# Patient Record
Sex: Female | Born: 1975 | Hispanic: No | Marital: Single | Smoking: Current some day smoker
Health system: Southern US, Community
[De-identification: ages and names within clinical notes are randomized; demographics above are authoritative.]

## PROBLEM LIST (undated history)

## (undated) DIAGNOSIS — E119 Type 2 diabetes mellitus without complications: Secondary | ICD-10-CM

## (undated) DIAGNOSIS — D649 Anemia, unspecified: Secondary | ICD-10-CM

## (undated) HISTORY — PX: TUBAL LIGATION: SHX77

---

## 2018-01-11 ENCOUNTER — Emergency Department (HOSPITAL_COMMUNITY)
Admission: EM | Admit: 2018-01-11 | Discharge: 2018-01-11 | Disposition: A | Discharge status: Home / Self Care | Payer: Self-pay | Attending: Emergency Medicine | Admitting: Emergency Medicine

## 2018-01-11 ENCOUNTER — Encounter (HOSPITAL_COMMUNITY): Payer: Self-pay | Admitting: Emergency Medicine

## 2018-01-11 ENCOUNTER — Other Ambulatory Visit: Payer: Self-pay

## 2018-01-11 DIAGNOSIS — E119 Type 2 diabetes mellitus without complications: Secondary | ICD-10-CM | POA: Insufficient documentation

## 2018-01-11 DIAGNOSIS — N10 Acute tubulo-interstitial nephritis: Secondary | ICD-10-CM | POA: Insufficient documentation

## 2018-01-11 DIAGNOSIS — D649 Anemia, unspecified: Secondary | ICD-10-CM | POA: Insufficient documentation

## 2018-01-11 DIAGNOSIS — F17210 Nicotine dependence, cigarettes, uncomplicated: Secondary | ICD-10-CM | POA: Insufficient documentation

## 2018-01-11 DIAGNOSIS — N12 Tubulo-interstitial nephritis, not specified as acute or chronic: Secondary | ICD-10-CM

## 2018-01-11 HISTORY — DX: Anemia, unspecified: D64.9

## 2018-01-11 HISTORY — DX: Type 2 diabetes mellitus without complications: E11.9

## 2018-01-11 LAB — URINALYSIS, ROUTINE W REFLEX MICROSCOPIC
Bilirubin Urine: NEGATIVE
Glucose, UA: NEGATIVE mg/dL
Ketones, ur: 15 mg/dL — AB
Nitrite: POSITIVE — AB
Protein, ur: >300 mg/dL — AB
Specific Gravity, Urine: 1.025 (ref 1.005–1.030)
pH: 6.5 (ref 5.0–8.0)

## 2018-01-11 LAB — CBC
HCT: 35.8 % — ABNORMAL LOW (ref 36.0–46.0)
Hemoglobin: 11 g/dL — ABNORMAL LOW (ref 12.0–15.0)
MCH: 23.9 pg — ABNORMAL LOW (ref 26.0–34.0)
MCHC: 30.7 g/dL (ref 30.0–36.0)
MCV: 77.7 fL — ABNORMAL LOW (ref 78.0–100.0)
Platelets: 386 10*3/uL (ref 150–400)
RBC: 4.61 MIL/uL (ref 3.87–5.11)
RDW: 14.9 % (ref 11.5–15.5)
WBC: 8.4 10*3/uL (ref 4.0–10.5)

## 2018-01-11 LAB — URINALYSIS, MICROSCOPIC (REFLEX)
RBC / HPF: >50 RBC/hpf (ref 0–5)
WBC, UA: >50 WBC/hpf (ref 0–5)

## 2018-01-11 LAB — BASIC METABOLIC PANEL
Anion gap: 8 (ref 5–15)
BUN: 9 mg/dL (ref 6–20)
CO2: 21 mmol/L — ABNORMAL LOW (ref 22–32)
Calcium: 8.3 mg/dL — ABNORMAL LOW (ref 8.9–10.3)
Chloride: 109 mmol/L (ref 98–111)
Creatinine, Ser: 0.64 mg/dL (ref 0.44–1.00)
GFR calc Af Amer: >60 mL/min (ref >60)
GFR calc non Af Amer: >60 mL/min (ref >60)
Glucose, Bld: 109 mg/dL — ABNORMAL HIGH (ref 70–99)
Potassium: 3.7 mmol/L (ref 3.5–5.1)
Sodium: 138 mmol/L (ref 135–145)

## 2018-01-11 LAB — I-STAT BETA HCG BLOOD, ED (MC, WL, AP ONLY): I-stat hCG, quantitative: <5 m[IU]/mL (ref <5)

## 2018-01-11 MED ORDER — CEPHALEXIN 500 MG PO CAPS: 500.0000 mg | ORAL_CAPSULE | Freq: Two times a day (BID) | ORAL | 0 refills | Status: AC

## 2018-01-11 NOTE — ED Triage Notes (Signed)
Pt reports she began feeling like she had urinary urgency and pain with urination on Wednesday, states she is now having flank pain radiating to her lower abd as well.

## 2018-01-11 NOTE — ED Notes (Signed)
Patient verbalizes understanding of discharge instructions. Opportunity for questioning and answers were provided. Armband removed by staff, pt discharged from ED.  

## 2018-01-11 NOTE — Discharge Instructions (Addendum)
You were evaluated today for urinary frequency.  Your urine did show that you have a urinary tract infection.  Given your left sided flank pain it is possible you have an infection in your kidneys. I will prescribe you an antibiotic called Keflex. Please take as prescribed. Follow-up with your PCP if symptoms do not resolved.   Return to the ED with any new or worsening symptoms such as: Contact a health care provider if: Your symptoms do not get better after 2 days of treatment. Your symptoms get worse. You have a fever. Get help right away if: You are unable to take your antibiotics or fluids. You have shaking chills. You vomit. You have severe flank or back pain. You have extreme weakness or fainting.

## 2018-01-11 NOTE — ED Provider Notes (Signed)
[facility] EMERGENCY DEPARTMENT Provider Note   CSN: [id number] Arrival date & time: 01/11/18  1117   History   Chief Complaint Chief Complaint  Patient presents with  . Urinary Frequency    HPI Joy Cruz is a 42 y.o. female with chronic anemia who presents for evaluation of dysuria x2 days.  States she has a history of frequent UTIs.  States over the past 2 days she feels like she has to frequently urinate, as well as suprapubic pressure. Patient woke up this morning with left-sided flank pain.  Rates her pain a 5/10.  Pain does not radiate.  Denies aggravating or alleviating factors.  States she did start her menstrual cycle this morning.  Denies fever, chills, nausea, vomiting, chest pain, shortness of breath, diarrhea, constipation.  HPI  Past Medical History:  Diagnosis Date  . Anemia   . Diabetes mellitus without complication (HCC)     There are no active problems to display for this patient.   Past Surgical History:  Procedure Laterality Date  . CESAREAN SECTION       OB History   None      Home Medications    Prior to Admission medications   Medication Sig Start Date End Date Taking? Authorizing Provider  cephALEXin (KEFLEX) 500 MG capsule Take 1 capsule (500 mg total) by mouth 2 (two) times daily for 14 days. 01/11/18 01/25/18  Amariyon Maynes A, PA-C    Family History No family history on file.  Social History Social History   Tobacco Use  . Smoking status: Current Some Day Smoker  Substance Use Topics  . Alcohol use: Never    Frequency: Never  . Drug use: Never     Allergies   Patient has no known allergies.   Review of Systems Review of Systems  Constitutional: Negative for activity change, appetite change, chills, diaphoresis, fatigue, fever and unexpected weight change.  Respiratory: Negative for cough, chest tightness and shortness of breath.   Cardiovascular: Negative for chest pain and leg swelling.    Gastrointestinal: Negative for abdominal distention, blood in stool, constipation, diarrhea, nausea, rectal pain and vomiting.       Suprapubic pressure.  Genitourinary: Positive for dysuria, flank pain, frequency, urgency and vaginal bleeding. Negative for decreased urine volume, difficulty urinating, hematuria, menstrual problem, pelvic pain, vaginal discharge and vaginal pain.  Musculoskeletal: Negative for back pain.  Skin: Negative.   Neurological: Negative for dizziness, weakness, light-headedness, numbness and headaches.     Physical Exam Updated Vital Signs BP 121/77 (BP Location: Right Arm)   Pulse (!) 58   Temp 98.6 F (37 C) (Oral)   Resp 18   Ht 5\' 8"  (1.727 m)   Wt 132.9 kg   LMP 01/11/2018 (Exact Date)   SpO2 99%   BMI 44.55 kg/m   Physical Exam  Constitutional: She appears well-developed and well-nourished. No distress.  HENT:  Head: Atraumatic.  Mouth/Throat: Oropharynx is clear and moist.  Eyes: Pupils are equal, round, and reactive to light.  Neck: Normal range of motion.  Cardiovascular: Normal rate, regular rhythm, normal heart sounds and intact distal pulses. Exam reveals no gallop.  No murmur heard. Pulmonary/Chest: Effort normal and breath sounds normal. No stridor. No respiratory distress. She has no wheezes. She has no rales. She exhibits no tenderness.  Abdominal: Soft. Normal appearance and bowel sounds are normal. She exhibits no distension and no mass. There is no hepatosplenomegaly. There is no tenderness. There is no rigidity,  no rebound and no guarding. No hernia.  Mild left-sided CVA tenderness.  No suprapubic tenderness to palpation.   Musculoskeletal: Normal range of motion.       Lumbar back: Normal.  Neurological: She is alert.  Skin: Skin is warm and dry. She is not diaphoretic.  Psychiatric: She has a normal mood and affect.  Nursing note and vitals reviewed.    ED Treatments / Results  Labs (all labs ordered are listed, but only  abnormal results are displayed) Labs Reviewed  URINALYSIS, ROUTINE W REFLEX MICROSCOPIC - Abnormal; Notable for the following components:      Result Value   Color, Urine RED (*)    APPearance CLOUDY (*)    Hgb urine dipstick LARGE (*)    Ketones, ur 15 (*)    Protein, ur >300 (*)    Nitrite POSITIVE (*)    Leukocytes, UA MODERATE (*)    All other components within normal limits  CBC - Abnormal; Notable for the following components:   Hemoglobin 11.0 (*)    HCT 35.8 (*)    MCV 77.7 (*)    MCH 23.9 (*)    All other components within normal limits  BASIC METABOLIC PANEL - Abnormal; Notable for the following components:   CO2 21 (*)    Glucose, Bld 109 (*)    Calcium 8.3 (*)    All other components within normal limits  URINALYSIS, MICROSCOPIC (REFLEX) - Abnormal; Notable for the following components:   Bacteria, UA PRESENT (*)    All other components within normal limits  URINE CULTURE  I-STAT BETA HCG BLOOD, ED ([facility], [facility], [facility] ONLY)    EKG None  Radiology No results found.  Procedures Procedures (including critical care time)  Medications Ordered in ED Medications - No data to display   Initial Impression / Assessment and Plan / ED Course  I have reviewed the triage vital signs and the nursing notes as well as past medical history.  Pertinent labs & imaging results that were available during my care of the patient were reviewed by me and considered in my medical decision making (see chart for details).  42 year old female who appears otherwise well presents for evaluation of urinary frequency x2 days.  History of frequent UTIs.  Mild left CVA tenderness.  No suprapubic tenderness.  Urine with positive nitrite and moderate leukocytes.  Proteinuria with ketones.  Large hemoglobin and urine, however started menstrual cycle this morning. Labs without leukocytosis. Afebrile, nonseptic, non ill appearing.  HCG negative.  Given history and physical exam feel patient most likely  has uncomplicated pyelonephritis.  Will DC home with Keflex.  Low suspicion for phrenic abscess at this time. Patient does not meet the SIRS or Sepsis criteria.  On repeat exam patient does not have a surgical abdomin and there are no peritoneal signs.  No indication of appendicitis, bowel obstruction, bowel perforation, cholecystitis, diverticulitis, PID or ectopic pregnancy. Do not feel patient needs a CT scan as she has a benign abdominal exam with mild CVA tenderness.   Discussed with patient follow-up with her PCP for her proteinuria. Discussed with patient strict return precautions.  Patient stable for discharge home.  Patient voiced understanding of discharge instructions and is agreeable for follow-up.  Clinical Course as of Jan 12 1607  Sat Jan 11, 2018  1538 I-Stat beta hCG blood, ED [BH]  1550 Chronic anemia, on iron supplementation.  Hemoglobin(!): 11.0 [BH]    Clinical Course User Index [BH] Arnika Larzelere A,  PA-C    Final Clinical Impressions(s) / ED Diagnoses   Final diagnoses:  Pyelonephritis    ED Discharge Orders         Ordered    cephALEXin (KEFLEX) 500 MG capsule  2 times daily     01/11/18 1603           Shareese Macha A, PA-C 01/11/18 1612    Raeford Razor, MD 01/13/18 1359

## 2018-01-13 LAB — URINE CULTURE: Culture: >=100000 — AB

## 2018-01-14 ENCOUNTER — Telehealth: Payer: Self-pay | Admitting: Emergency Medicine

## 2018-01-14 NOTE — Telephone Encounter (Signed)
Post ED Visit - Positive Culture Follow-up  Culture report reviewed by antimicrobial stewardship pharmacist:  [x]  Enzo Bi, Pharm.D. []  Celedonio Miyamoto, Pharm.D., BCPS AQ-ID []  Garvin Fila, Pharm.D., BCPS []  Georgina Pillion, Pharm.D., BCPS []  [place], [address].D., BCPS, AAHIVP []  Estella Husk, Pharm.D., BCPS, AAHIVP []  Lysle Pearl, PharmD, BCPS []  Phillips Climes, PharmD, BCPS []  Agapito Games, PharmD, BCPS []  Verlan Friends, PharmD  Positive urine culture Treated with cephalexin, organism sensitive to the same and no further patient follow-up is required at this time.  Berle Mull 01/14/2018, 9:30 AM

## 2018-03-09 ENCOUNTER — Emergency Department (HOSPITAL_COMMUNITY)
Admission: EM | Admit: 2018-03-09 | Discharge: 2018-03-09 | Disposition: A | Discharge status: Home / Self Care | Payer: Self-pay | Attending: Emergency Medicine | Admitting: Emergency Medicine

## 2018-03-09 ENCOUNTER — Encounter (HOSPITAL_COMMUNITY): Payer: Self-pay

## 2018-03-09 ENCOUNTER — Other Ambulatory Visit: Payer: Self-pay

## 2018-03-09 DIAGNOSIS — N938 Other specified abnormal uterine and vaginal bleeding: Secondary | ICD-10-CM | POA: Insufficient documentation

## 2018-03-09 DIAGNOSIS — E119 Type 2 diabetes mellitus without complications: Secondary | ICD-10-CM | POA: Insufficient documentation

## 2018-03-09 DIAGNOSIS — Z79899 Other long term (current) drug therapy: Secondary | ICD-10-CM | POA: Insufficient documentation

## 2018-03-09 DIAGNOSIS — J45909 Unspecified asthma, uncomplicated: Secondary | ICD-10-CM | POA: Insufficient documentation

## 2018-03-09 DIAGNOSIS — Z7984 Long term (current) use of oral hypoglycemic drugs: Secondary | ICD-10-CM | POA: Insufficient documentation

## 2018-03-09 DIAGNOSIS — F17200 Nicotine dependence, unspecified, uncomplicated: Secondary | ICD-10-CM | POA: Insufficient documentation

## 2018-03-09 LAB — WET PREP, GENITAL
SPERM: NONE SEEN
TRICH WET PREP: NONE SEEN
Yeast Wet Prep HPF POC: NONE SEEN

## 2018-03-09 LAB — BASIC METABOLIC PANEL
Anion gap: 6 (ref 5–15)
BUN: 10 mg/dL (ref 6–20)
CALCIUM: 8.6 mg/dL — AB (ref 8.9–10.3)
CO2: 24 mmol/L (ref 22–32)
Chloride: 108 mmol/L (ref 98–111)
Creatinine, Ser: 0.56 mg/dL (ref 0.44–1.00)
GFR calc Af Amer: >60 mL/min (ref >60)
GLUCOSE: 132 mg/dL — AB (ref 70–99)
Potassium: 3.7 mmol/L (ref 3.5–5.1)
SODIUM: 138 mmol/L (ref 135–145)

## 2018-03-09 LAB — CBC
HCT: 35.3 % — ABNORMAL LOW (ref 36.0–46.0)
Hemoglobin: 10.6 g/dL — ABNORMAL LOW (ref 12.0–15.0)
MCH: 22.7 pg — AB (ref 26.0–34.0)
MCHC: 30 g/dL (ref 30.0–36.0)
MCV: 75.8 fL — AB (ref 80.0–100.0)
PLATELETS: 437 10*3/uL — AB (ref 150–400)
RBC: 4.66 MIL/uL (ref 3.87–5.11)
RDW: 16.1 % — AB (ref 11.5–15.5)
WBC: 7.1 10*3/uL (ref 4.0–10.5)
nRBC: 0 % (ref 0.0–0.2)

## 2018-03-09 LAB — I-STAT BETA HCG BLOOD, ED (MC, WL, AP ONLY): I-stat hCG, quantitative: <5 m[IU]/mL (ref <5)

## 2018-03-09 MED ORDER — MEDROXYPROGESTERONE ACETATE 5 MG PO TABS: 10.0000 mg | ORAL_TABLET | Freq: Every day | ORAL | 0 refills | Status: DC

## 2018-03-09 NOTE — ED Provider Notes (Signed)
[facility]-EMERGENCY DEPT Provider Note   CSN: [id number] Arrival date & time: 03/09/18  0945     History   Chief Complaint Chief Complaint  Patient presents with  . Vaginal Bleeding    HPI Joy Cruz is a 42 y.o. female.  HPI Patient is a 42 year old female presents the emergency department complaints of worsening vaginal bleeding since last night.  She reports this is the normal time for her menstrual cycle to start but reports her bleeding is much heavier.  She is gone through several pads to the night.  She is never had what sounds like dysfunctional uterine bleeding before.  No prior history of uterine fibroids.  No significant abdominal pain.  No lightheadedness or weakness.  She has had a history of anemia before including blood transfusion in January 2019 with iron transfusions when she was living in [place].  She recently relocated to [place] 2 months ago.  She does not have a primary care physician or gynecologist at this point.  Symptoms worsen since last night.   Past Medical History:  Diagnosis Date  . Anemia   . Diabetes mellitus without complication (HCC)     There are no active problems to display for this patient.   Past Surgical History:  Procedure Laterality Date  . CESAREAN SECTION       OB History   None      Home Medications    Prior to Admission medications   Medication Sig Start Date End Date Taking? Authorizing Provider  acetaminophen (TYLENOL) 500 MG tablet Take 1,000 mg by mouth every 6 (six) hours as needed for moderate pain or headache.   Yes [provider]  IRON PO Take 1 tablet by mouth 2 (two) times daily.   Yes [provider]  metFORMIN (GLUCOPHAGE) 500 MG tablet Take 500 mg by mouth 2 (two) times daily with a meal.   Yes [provider]  medroxyPROGESTERone (PROVERA) 5 MG tablet Take 2 tablets (10 mg total) by mouth daily for 10 days. 03/09/18 03/19/18  Azalia Bilis, MD      Family History No family history on file.  Social History Social History   Tobacco Use  . Smoking status: Current Some Day Smoker  . Smokeless tobacco: Never Used  . Tobacco comment: ceremonial reasons  Substance Use Topics  . Alcohol use: Never    Frequency: Never  . Drug use: Never     Allergies   Patient has no known allergies.   Review of Systems Review of Systems  All other systems reviewed and are negative.    Physical Exam Updated Vital Signs BP 130/87   Pulse (!) 56   Temp 98.2 F (36.8 C) (Oral)   Resp 16   Ht 5\' 8"  (1.727 m)   Wt 131.5 kg   LMP 03/08/2018   SpO2 100%   BMI 44.09 kg/m   Physical Exam  Constitutional: She is oriented to person, place, and time. She appears well-developed and well-nourished. No distress.  HENT:  Head: Normocephalic and atraumatic.  Eyes: EOM are normal.  Neck: Normal range of motion.  Cardiovascular: Normal rate and regular rhythm.  Pulmonary/Chest: Effort normal and breath sounds normal.  Abdominal: Soft. She exhibits no distension. There is no tenderness.  Genitourinary:  Genitourinary Comments: Chaperone present.  Small amount of blood coming from the cervical loss.  No vaginal lesions noted  Musculoskeletal: Normal range of motion.  Neurological: She is alert and oriented to person, place,  and time.  Skin: Skin is warm and dry.  Psychiatric: She has a normal mood and affect. Judgment normal.  Nursing note and vitals reviewed.    ED Treatments / Results  Labs (all labs ordered are listed, but only abnormal results are displayed) Labs Reviewed  CBC - Abnormal; Notable for the following components:      Result Value   Hemoglobin 10.6 (*)    HCT 35.3 (*)    MCV 75.8 (*)    MCH 22.7 (*)    RDW 16.1 (*)    Platelets 437 (*)    All other components within normal limits  BASIC METABOLIC PANEL - Abnormal; Notable for the following components:   Glucose, Bld 132 (*)    Calcium 8.6 (*)    All other  components within normal limits  I-STAT BETA HCG BLOOD, ED ([facility], [facility], [facility] ONLY)  WET PREP  (BD AFFIRM) (Benjamin Perez)  GC/CHLAMYDIA PROBE AMP ([place]) NOT AT [facility]    EKG None  Radiology No results found.  Procedures Procedures (including critical care time)  Medications Ordered in ED Medications - No data to display   Initial Impression / Assessment and Plan / ED Course  I have reviewed the triage vital signs and the nursing notes.  Pertinent labs & imaging results that were available during my care of the patient were reviewed by me and considered in my medical decision making (see chart for details).     She will be placed on a case of Provera.  GYN follow-up.  Patient understands [facility] for any new or worsening symptoms.  Hemoglobin 10.6.  vital signs are without significant abnormality.  Final Clinical Impressions(s) / ED Diagnoses   Final diagnoses:  Dysfunctional uterine bleeding    ED Discharge Orders         Ordered    medroxyPROGESTERone (PROVERA) 5 MG tablet  Daily     03/09/18 1218           Azalia Bilisampos, Aslin Farinas, MD 03/10/18 1720

## 2018-03-09 NOTE — ED Triage Notes (Signed)
Pt states that her period started yesterday, and that she has had a significant amount of bleeding. Pt states that she has been going through 4 overnight pads/hour.

## 2018-03-10 LAB — GC/CHLAMYDIA PROBE AMP (~~LOC~~) NOT AT ARMC
Chlamydia: NEGATIVE
NEISSERIA GONORRHEA: NEGATIVE

## 2018-03-31 ENCOUNTER — Encounter: Payer: Self-pay | Admitting: Obstetrics and Gynecology

## 2018-03-31 ENCOUNTER — Telehealth: Payer: Self-pay | Admitting: General Practice

## 2018-03-31 ENCOUNTER — Ambulatory Visit (INDEPENDENT_AMBULATORY_CARE_PROVIDER_SITE_OTHER): Payer: Self-pay | Admitting: Obstetrics and Gynecology

## 2018-03-31 VITALS — BP 126/77 | HR 62 | Wt 286.3 lb

## 2018-03-31 DIAGNOSIS — N939 Abnormal uterine and vaginal bleeding, unspecified: Secondary | ICD-10-CM

## 2018-03-31 DIAGNOSIS — R102 Pelvic and perineal pain: Secondary | ICD-10-CM

## 2018-03-31 MED ORDER — MEDROXYPROGESTERONE ACETATE 10 MG PO TABS: 20.0000 mg | ORAL_TABLET | Freq: Every day | ORAL | 2 refills | Status: DC

## 2018-03-31 NOTE — Addendum Note (Signed)
Addended by: Leroy LibmanAVIS, Bernard Slayden on: 03/31/2018 04:27 PM   Modules accepted: Orders

## 2018-03-31 NOTE — Telephone Encounter (Signed)
Patient called and left message on nurse voicemail line [facility] there[facility]   Dr Earlene Plateravis ordered Rx for patient. Called patient, no answer- message states call cannot be completed at this time.

## 2018-03-31 NOTE — Progress Notes (Signed)
GYNECOLOGY OFFICE FOLLOW UP NOTE  History:  42 y.o. [id number] here today for follow up for ED visit for heavy bleeding. She was seen because she had very heavy bleeding when she started her period this most recent time. She was scared when she had "dripping" of her period, was much heavier than it had ever been before. Went to ED and was prescribed provera, which slowed her bleeding down but it has not stopped yet.  Has never had heavy bleeding like this. Reports yesterday and today she is having bleeding like a period. Having "mild to moderate" pain like menstrual cramps around her pelvis and her lower back.   Normally has regular, monthly periods. Doesn't miss periods and they are not heavy like this. H/o IV iron transfusion in 03/2017 for severe anemia.  Past Medical History:  Diagnosis Date  . Anemia   . Diabetes mellitus without complication [facility](HCC)     Past Surgical History:  Procedure Laterality Date  . CESAREAN SECTION    . TUBAL LIGATION    3 x SVD, 1 x CS   Current Outpatient Medications:  .  acetaminophen (TYLENOL) 500 MG tablet, Take 1,000 mg by mouth every 6 (six) hours as needed for moderate pain or headache., Disp: , Rfl:  .  IRON PO, Take 1 tablet by mouth 2 (two) times daily., Disp: , Rfl:  .  metFORMIN (GLUCOPHAGE) 500 MG tablet, Take 500 mg by mouth 2 (two) times daily with a meal., Disp: , Rfl:   The following portions of the patient's history were reviewed and updated as appropriate: allergies, current medications, past family history, past medical history, past social history, past surgical history and problem list.   Review of Systems:  Pertinent items noted in HPI and remainder of comprehensive ROS otherwise negative.   Objective:  Physical Exam BP 126/77   Pulse 62   Wt 286 lb 4.8 oz (129.9 kg)   LMP 03/08/2018   BMI 43.53 kg/m  CONSTITUTIONAL: Well-developed, well-nourished female in no acute distress.  HENT:  Normocephalic, atraumatic. External  right and left ear normal. Oropharynx is clear and moist EYES: Conjunctivae and EOM are normal. Pupils are equal, round, and reactive to light. No scleral icterus.  NECK: Normal range of motion, supple, no masses SKIN: Skin is warm and dry. No rash noted. Not diaphoretic. No erythema. No pallor. NEUROLOGIC: Alert and oriented to person, place, and time. Normal reflexes, muscle tone coordination. No cranial nerve deficit noted. PSYCHIATRIC: Normal mood and affect. Normal behavior. Normal judgment and thought content. CARDIOVASCULAR: Normal heart rate noted RESPIRATORY: Effort normal, no problems with respiration noted ABDOMEN: Soft, no distention noted.   PELVIC: Normal appearing external genitalia; normal appearing vaginal mucosa and cervix.  No abnormal discharge noted.  Normal uterine size, no other palpable masses, no uterine tenderness. Mild right adnexal tenderness MUSCULOSKELETAL: Normal range of motion. No edema noted.  Labs and Imaging No results found.  Assessment & Plan:  1. Abnormal uterine bleeding (AUB) - will obtain US and have patient return for EMB - to continue provera for now - briefly reviewed possible causes for AUB and need for further workup, she is agreeable - TSH - US PELVIC COMPLETE WITH TRANSVAGINAL; Future - CBC  2. Pelvic pain See above  Will obtain records from GYN in [place], states had pap 1 year ago there  Routine preventative health maintenance measures emphasized. Please refer to After Visit Summary for other counseling recommendations.   Return in about 3  weeks (around 04/21/2018) for Followup.  Total face-to-face time with patient: 30 minutes. Over 50% of encounter was spent on counseling and coordination of care.  Baldemar Lenis, M.D. Attending Center for [employer] [facility])

## 2018-03-31 NOTE — Progress Notes (Signed)
Ultrasound scheduled for 04/03/18 @ 11am. Pt made aware.

## 2018-04-01 LAB — CBC
HEMATOCRIT: 30.4 % — AB (ref 34.0–46.6)
HEMOGLOBIN: 10 g/dL — AB (ref 11.1–15.9)
MCH: 22.7 pg — AB (ref 26.6–33.0)
MCHC: 32.9 g/dL (ref 31.5–35.7)
MCV: 69 fL — AB (ref 79–97)
Platelets: 465 10*3/uL — ABNORMAL HIGH (ref 150–450)
RBC: 4.4 x10E6/uL (ref 3.77–5.28)
RDW: 14.4 % (ref 12.3–15.4)
WBC: 8.2 10*3/uL (ref 3.4–10.8)

## 2018-04-01 LAB — TSH: TSH: 2.67 u[IU]/mL (ref 0.450–4.500)

## 2018-04-03 ENCOUNTER — Ambulatory Visit (HOSPITAL_COMMUNITY)
Admission: RE | Admit: 2018-04-03 | Discharge: 2018-04-03 | Disposition: A | Discharge status: Home / Self Care | Payer: Self-pay | Source: Ambulatory Visit | Attending: Obstetrics and Gynecology | Admitting: Obstetrics and Gynecology

## 2018-04-03 DIAGNOSIS — N854 Malposition of uterus: Secondary | ICD-10-CM | POA: Insufficient documentation

## 2018-04-03 DIAGNOSIS — Q505 Embryonic cyst of broad ligament: Secondary | ICD-10-CM | POA: Insufficient documentation

## 2018-04-03 DIAGNOSIS — N939 Abnormal uterine and vaginal bleeding, unspecified: Secondary | ICD-10-CM | POA: Insufficient documentation

## 2018-04-29 ENCOUNTER — Emergency Department (HOSPITAL_COMMUNITY)
Admission: EM | Admit: 2018-04-29 | Discharge: 2018-04-29 | Disposition: A | Discharge status: Home / Self Care | Payer: Self-pay | Attending: Emergency Medicine | Admitting: Emergency Medicine

## 2018-04-29 ENCOUNTER — Other Ambulatory Visit: Payer: Self-pay

## 2018-04-29 DIAGNOSIS — E119 Type 2 diabetes mellitus without complications: Secondary | ICD-10-CM | POA: Insufficient documentation

## 2018-04-29 DIAGNOSIS — Z7984 Long term (current) use of oral hypoglycemic drugs: Secondary | ICD-10-CM | POA: Insufficient documentation

## 2018-04-29 DIAGNOSIS — F17210 Nicotine dependence, cigarettes, uncomplicated: Secondary | ICD-10-CM | POA: Insufficient documentation

## 2018-04-29 DIAGNOSIS — L600 Ingrowing nail: Secondary | ICD-10-CM | POA: Insufficient documentation

## 2018-04-29 DIAGNOSIS — Z79899 Other long term (current) drug therapy: Secondary | ICD-10-CM | POA: Insufficient documentation

## 2018-04-29 LAB — CBG MONITORING, ED: Glucose-Capillary: 139 mg/dL — ABNORMAL HIGH (ref 70–99)

## 2018-04-29 MED ORDER — TRAMADOL HCL 50 MG PO TABS: 50.0000 mg | ORAL_TABLET | Freq: Four times a day (QID) | ORAL | 0 refills | Status: DC | PRN

## 2018-04-29 MED ORDER — SULFAMETHOXAZOLE-TRIMETHOPRIM 800-160 MG PO TABS: 1.0000 | ORAL_TABLET | Freq: Two times a day (BID) | ORAL | 0 refills | Status: AC

## 2018-04-29 MED ORDER — LIDOCAINE HCL (PF) 1 % IJ SOLN
30.0000 mL | Freq: Once | INTRAMUSCULAR | Status: AC
  Administered 2018-04-29: 30 mL
  Filled 2018-04-29: qty 30

## 2018-04-29 NOTE — ED Notes (Signed)
Patient verbalized understanding of discharge instructions and denies any further needs or questions at this time. VS stable. Patient ambulatory with steady gait.  

## 2018-04-29 NOTE — ED Provider Notes (Addendum)
[facility] EMERGENCY DEPARTMENT Provider Note   CSN: [id number] Arrival date & time: 04/29/18  1110     History   Chief Complaint No chief complaint on file.   HPI Joy Cruz is a 43 y.o. female.  43 year old female with prior medical history as detailed below presents for evaluation of ingrown nail on the left great toe.  Patient reports prior history of ingrown nails.  Patient reports that her left great toenail has become tender and inflamed.  She reports drainage from the lateral aspect of the left great toenail.  This drainage is been over the last 3 to 4 days.  She denies fever.  She denies other complaint.  She requests removal of the ingrown nail.  She reports a prior history of diabetes which is well controlled.  She denies prior significant history of foot infection.  The history is provided by the patient and medical records.  Illness  Location:  Left great toenail infection Severity:  Mild Duration:  6 days Timing:  Constant Progression:  Worsening Chronicity:  New Associated symptoms: no fever     Past Medical History:  Diagnosis Date  . Anemia   . Diabetes mellitus without complication (HCC)     There are no active problems to display for this patient.   Past Surgical History:  Procedure Laterality Date  . CESAREAN SECTION    . TUBAL LIGATION       OB History   No obstetric history on file.      Home Medications    Prior to Admission medications   Medication Sig Start Date End Date Taking? Authorizing Provider  acetaminophen (TYLENOL) 500 MG tablet Take 1,000 mg by mouth every 6 (six) hours as needed for moderate pain or headache.    [provider]  IRON PO Take 1 tablet by mouth 2 (two) times daily.    [provider]  medroxyPROGESTERone (PROVERA) 10 MG tablet Take 2 tablets (20 mg total) by mouth daily. 03/31/18   Conan Bowens, MD  metFORMIN (GLUCOPHAGE) 500 MG tablet Take 500 mg by mouth 2  (two) times daily with a meal.    [provider]    Family History No family history on file.  Social History Social History   Tobacco Use  . Smoking status: Current Some Day Smoker  . Smokeless tobacco: Never Used  . Tobacco comment: ceremonial reasons  Substance Use Topics  . Alcohol use: Never    Frequency: Never  . Drug use: Never     Allergies   Patient has no known allergies.   Review of Systems Review of Systems  Constitutional: Negative for fever.  All other systems reviewed and are negative.    Physical Exam Updated Vital Signs BP 123/83 (BP Location: Right Arm)   Pulse 72   Temp 98.2 F (36.8 C) (Oral)   Resp 16   Ht 5\' 8"  (1.727 m)   Wt 130.6 kg   LMP 03/04/2018 (Approximate)   SpO2 98%   BMI 43.79 kg/m   Physical Exam Vitals signs and nursing note reviewed.  Constitutional:      Appearance: Normal appearance.  HENT:     Head: Normocephalic and atraumatic.     Mouth/Throat:     Mouth: Mucous membranes are moist.  Eyes:     Pupils: Pupils are equal, round, and reactive to light.  Neck:     Musculoskeletal: Normal range of motion.  Cardiovascular:     Rate and  Rhythm: Normal rate and regular rhythm.  Pulmonary:     Effort: Pulmonary effort is normal.     Breath sounds: Normal breath sounds.  Musculoskeletal: Normal range of motion.  Skin:    General: Skin is warm.     Comments: Mild irritation of the left lateral aspect of the right great toenail.  Nail is ingrown.  There is no significant cellulitis or abscess formation.  Neurological:     General: No focal deficit present.     Mental Status: She is alert and oriented to person, place, and time. Mental status is at baseline.      ED Treatments / Results  Labs (all labs ordered are listed, but only abnormal results are displayed) Labs Reviewed  CBG MONITORING, ED - Abnormal; Notable for the following components:      Result Value   Glucose-Capillary 139 (*)    All  other components within normal limits    EKG None  Radiology No results found.  Procedures Excise ingrown toenail Date/Time: 04/29/2018 12:47 PM Performed by: Wynetta FinesMessick, Caelan Branden C, MD Authorized by: Wynetta FinesMessick, Aerielle Stoklosa C, MD  Consent: Verbal consent obtained. Consent given by: patient Patient understanding: patient states understanding of the procedure being performed Patient consent: the patient's understanding of the procedure matches consent given Procedure consent: procedure consent matches procedure scheduled Relevant documents: relevant documents present and verified Test results: test results available and properly labeled Site marked: the operative site was marked Imaging studies: imaging studies available Patient identity confirmed: verbally with patient Time out: Immediately prior to procedure a "time out" was called to verify the correct patient, procedure, equipment, support staff and site/side marked as required. Preparation: Patient was prepped and draped in the usual sterile fashion. Local anesthesia used: yes Anesthesia: local infiltration  Anesthesia: Local anesthesia used: yes Local Anesthetic: lidocaine 1% without epinephrine Anesthetic total: 1 mL  Sedation: Patient sedated: no  Patient tolerance: Patient tolerated the procedure well with no immediate complications    (including critical care time)  Medications Ordered in ED Medications  lidocaine (PF) (XYLOCAINE) 1 % injection 30 mL (has no administration in time range)     Initial Impression / Assessment and Plan / ED Course  I have reviewed the triage vital signs and the nursing notes.  Pertinent labs & imaging results that were available during my care of the patient were reviewed by me and considered in my medical decision making (see chart for details).     MDM  Screen complete  Patient is presenting for evaluation of ingrown left great toenail.  Toenail does appear to be minimally  infected.  Patient consented to removal of the affected portion of the toenail.  This was done without significant complication.  Patient understands need for close follow-up.  She will be prescribed antibiotics and pain medication.  She does understand the need for close follow-up with both her regular doctor and podiatry.  Strict return precautions given and understood.  Importance of close follow-up is stressed.  Final Clinical Impressions(s) / ED Diagnoses   Final diagnoses:  Ingrown nail    ED Discharge Orders         Ordered    sulfamethoxazole-trimethoprim (BACTRIM DS,SEPTRA DS) 800-160 MG tablet  2 times daily     04/29/18 1245    traMADol (ULTRAM) 50 MG tablet  Every 6 hours PRN     04/29/18 1245           Wynetta FinesMessick, Kirk Sampley C, MD 04/29/18 1247  Wynetta FinesMessick, Monnie Gudgel C, MD 04/29/18 1248

## 2018-04-29 NOTE — ED Triage Notes (Signed)
Pt presents to ED for ingrown toenail on her L great toe x 1 week. Endorses puss draining out of it and pain that shoots to her head when she wears shoes. Has type 2 DM. Has been taking extra strength tylenol with intermittent relief.

## 2018-04-29 NOTE — Discharge Instructions (Signed)
Please return for any problem.  Follow-up with your regular care provider as instructed.  Follow-up with podiatry as instructed.

## 2018-08-30 ENCOUNTER — Encounter (HOSPITAL_COMMUNITY): Payer: Self-pay | Admitting: Emergency Medicine

## 2018-08-30 ENCOUNTER — Emergency Department (HOSPITAL_COMMUNITY)
Admission: EM | Admit: 2018-08-30 | Discharge: 2018-08-30 | Disposition: A | Discharge status: Home / Self Care | Payer: PRIVATE HEALTH INSURANCE | Attending: Emergency Medicine | Admitting: Emergency Medicine

## 2018-08-30 ENCOUNTER — Other Ambulatory Visit: Payer: Self-pay

## 2018-08-30 DIAGNOSIS — E119 Type 2 diabetes mellitus without complications: Secondary | ICD-10-CM | POA: Insufficient documentation

## 2018-08-30 DIAGNOSIS — Z7984 Long term (current) use of oral hypoglycemic drugs: Secondary | ICD-10-CM | POA: Insufficient documentation

## 2018-08-30 DIAGNOSIS — L03032 Cellulitis of left toe: Secondary | ICD-10-CM | POA: Insufficient documentation

## 2018-08-30 DIAGNOSIS — F17200 Nicotine dependence, unspecified, uncomplicated: Secondary | ICD-10-CM | POA: Insufficient documentation

## 2018-08-30 MED ORDER — SULFAMETHOXAZOLE-TRIMETHOPRIM 800-160 MG PO TABS: 1.0000 | ORAL_TABLET | Freq: Two times a day (BID) | ORAL | 0 refills | Status: AC

## 2018-08-30 MED ORDER — BUPIVACAINE HCL (PF) 0.5 % IJ SOLN
20.0000 mL | Freq: Once | INTRAMUSCULAR | Status: AC
  Administered 2018-08-30: 12:00:00 20 mL
  Filled 2018-08-30: qty 30

## 2018-08-30 MED ORDER — BACITRACIN ZINC 500 UNIT/GM EX OINT
TOPICAL_OINTMENT | CUTANEOUS | Status: AC
  Filled 2018-08-30: qty 0.9

## 2018-08-30 NOTE — ED Triage Notes (Signed)
Pt reports is type 2 diabetic and having swelling on left big toe for couple weeks after having it cut when got a pedicure back in March. For past week when press on it, pus will come out.

## 2018-08-30 NOTE — ED Provider Notes (Signed)
[facility]-EMERGENCY DEPT Provider Note   CSN: [id number] Arrival date & time: 08/30/18  1119    History   Chief Complaint Chief Complaint  Patient presents with  . Toe Pain    HPI Joy Cruz is a 43 y.o. female.     Pt presents to the ED today with left great toe pain.  The pt said she had a pedicure in March and the pedicurist cut her skin.  She has noticed some swelling at the tip of her toe.  She denies any f/c.  She is diabetic, and her sugars have been doing well.     Past Medical History:  Diagnosis Date  . Anemia   . Diabetes mellitus without complication (HCC)     There are no active problems to display for this patient.   Past Surgical History:  Procedure Laterality Date  . CESAREAN SECTION    . TUBAL LIGATION       OB History   No obstetric history on file.      Home Medications    Prior to Admission medications   Medication Sig Start Date End Date Taking? Authorizing Provider  acetaminophen (TYLENOL) 500 MG tablet Take 1,000 mg by mouth every 6 (six) hours as needed for moderate pain or headache.    [provider]  IRON PO Take 1 tablet by mouth 2 (two) times daily.    [provider]  medroxyPROGESTERone (PROVERA) 10 MG tablet Take 2 tablets (20 mg total) by mouth daily. 03/31/18   Conan Bowens, MD  metFORMIN (GLUCOPHAGE) 500 MG tablet Take 500 mg by mouth 2 (two) times daily with a meal.    [provider]  sulfamethoxazole-trimethoprim (BACTRIM DS) 800-160 MG tablet Take 1 tablet by mouth 2 (two) times daily for 7 days. 08/30/18 09/06/18  Jacalyn Lefevre, MD  traMADol (ULTRAM) 50 MG tablet Take 1 tablet (50 mg total) by mouth every 6 (six) hours as needed. 04/29/18   Wynetta Fines, MD    Family History No family history on file.  Social History Social History   Tobacco Use  . Smoking status: Current Some Day Smoker  . Smokeless tobacco: Never Used  . Tobacco comment: ceremonial  reasons  Substance Use Topics  . Alcohol use: Never    Frequency: Never  . Drug use: Never     Allergies   Patient has no known allergies.   Review of Systems Review of Systems  Musculoskeletal:       Left great toe pain  All other systems reviewed and are negative.    Physical Exam Updated Vital Signs BP (!) 135/91 (BP Location: Left Arm)   Pulse 64   Temp 98.1 F (36.7 C) (Oral)   Resp 18   LMP 08/09/2018   SpO2 99%   Physical Exam Vitals signs and nursing note reviewed.  Constitutional:      Appearance: Normal appearance.  HENT:     Head: Normocephalic and atraumatic.     Right Ear: External ear normal.     Left Ear: External ear normal.     Nose: Nose normal.     Mouth/Throat:     Mouth: Mucous membranes are moist.     Pharynx: Oropharynx is clear.  Eyes:     Extraocular Movements: Extraocular movements intact.     Conjunctiva/sclera: Conjunctivae normal.     Pupils: Pupils are equal, round, and reactive to light.  Neck:     Musculoskeletal: Normal range of  motion and neck supple.  Cardiovascular:     Rate and Rhythm: Normal rate and regular rhythm.     Pulses: Normal pulses.     Heart sounds: Normal heart sounds.  Pulmonary:     Effort: Pulmonary effort is normal.     Breath sounds: Normal breath sounds.  Abdominal:     General: Abdomen is flat. Bowel sounds are normal.     Palpations: Abdomen is soft.  Musculoskeletal:     Comments: Left great toe with paronychia  Skin:    General: Skin is warm.     Capillary Refill: Capillary refill takes less than 2 seconds.  Neurological:     General: No focal deficit present.     Mental Status: She is alert and oriented to person, place, and time.  Psychiatric:        Mood and Affect: Mood normal.        Behavior: Behavior normal.        Thought Content: Thought content normal.        Judgment: Judgment normal.      ED Treatments / Results  Labs (all labs ordered are listed, but only abnormal  results are displayed) Labs Reviewed - No data to display  EKG None  Radiology No results found.  Procedures .Marland Kitchen.Incision and Drainage Date/Time: 08/30/2018 12:32 PM Performed by: Jacalyn LefevreHaviland, Nabila Albarracin, MD Authorized by: Jacalyn LefevreHaviland, Rhyleigh Grassel, MD   Consent:    Consent obtained:  Verbal   Consent given by:  Patient   Risks discussed:  Bleeding and incomplete drainage   Alternatives discussed:  No treatment Location:    Type:  Abscess   Location:  Lower extremity   Lower extremity location:  Toe   Toe location:  L big toe Pre-procedure details:    Skin preparation:  Betadine Anesthesia (see MAR for exact dosages):    Anesthesia method:  Nerve block   Block needle gauge:  25 G   Block anesthetic:  Bupivacaine 0.5% w/o epi   Block technique:  Digital   Block injection procedure:  Anatomic landmarks identified, introduced needle, incremental injection, negative aspiration for blood and anatomic landmarks palpated   Block outcome:  Anesthesia achieved Procedure type:    Complexity:  Simple Procedure details:    Incision types:  Single straight   Scalpel blade:  11   Wound management:  Probed and deloculated   Drainage:  Bloody   Drainage amount:  Scant   Wound treatment:  Wound left open   Packing materials:  None Post-procedure details:    Patient tolerance of procedure:  Tolerated well, no immediate complications   (including critical care time)  Medications Ordered in ED Medications  bupivacaine (MARCAINE) 0.5 % injection 20 mL (20 mLs Infiltration Given by Other 08/30/18 1151)     Initial Impression / Assessment and Plan / ED Course  I have reviewed the triage vital signs and the nursing notes.  Pertinent labs & imaging results that were available during my care of the patient were reviewed by me and considered in my medical decision making (see chart for details).       Pt is instructed to f/u with Dr. Logan BoresEvans from podiatry.  Return if worse.  Final Clinical  Impressions(s) / ED Diagnoses   Final diagnoses:  Paronychia of great toe, left    ED Discharge Orders         Ordered    sulfamethoxazole-trimethoprim (BACTRIM DS) 800-160 MG tablet  2 times daily  08/30/18 1211           Jacalyn Lefevre, MD 08/30/18 1234

## 2019-03-05 ENCOUNTER — Emergency Department (HOSPITAL_COMMUNITY): Payer: PRIVATE HEALTH INSURANCE

## 2019-03-05 ENCOUNTER — Other Ambulatory Visit: Payer: Self-pay

## 2019-03-05 ENCOUNTER — Emergency Department (HOSPITAL_COMMUNITY)
Admission: EM | Admit: 2019-03-05 | Discharge: 2019-03-05 | Disposition: A | Discharge status: Home / Self Care | Payer: PRIVATE HEALTH INSURANCE | Attending: Emergency Medicine | Admitting: Emergency Medicine

## 2019-03-05 DIAGNOSIS — U071 COVID-19: Secondary | ICD-10-CM | POA: Diagnosis not present

## 2019-03-05 DIAGNOSIS — Z79899 Other long term (current) drug therapy: Secondary | ICD-10-CM | POA: Diagnosis not present

## 2019-03-05 DIAGNOSIS — E119 Type 2 diabetes mellitus without complications: Secondary | ICD-10-CM | POA: Insufficient documentation

## 2019-03-05 DIAGNOSIS — R0602 Shortness of breath: Secondary | ICD-10-CM | POA: Diagnosis not present

## 2019-03-05 DIAGNOSIS — B349 Viral infection, unspecified: Secondary | ICD-10-CM | POA: Insufficient documentation

## 2019-03-05 DIAGNOSIS — Z7984 Long term (current) use of oral hypoglycemic drugs: Secondary | ICD-10-CM | POA: Insufficient documentation

## 2019-03-05 DIAGNOSIS — F17210 Nicotine dependence, cigarettes, uncomplicated: Secondary | ICD-10-CM | POA: Diagnosis not present

## 2019-03-05 DIAGNOSIS — Z20822 Contact with and (suspected) exposure to covid-19: Secondary | ICD-10-CM

## 2019-03-05 DIAGNOSIS — R05 Cough: Secondary | ICD-10-CM | POA: Diagnosis present

## 2019-03-05 MED ORDER — DEXTROMETHORPHAN HBR 15 MG/5ML PO SYRP: 10.0000 mL | ORAL_SOLUTION | Freq: Four times a day (QID) | ORAL | 0 refills | Status: DC | PRN

## 2019-03-05 MED ORDER — ACETAMINOPHEN 500 MG PO TABS
1000.0000 mg | ORAL_TABLET | Freq: Once | ORAL | Status: AC
  Administered 2019-03-05: 1000 mg via ORAL
  Filled 2019-03-05: qty 2

## 2019-03-05 MED ORDER — BENZONATATE 100 MG PO CAPS: 100.0000 mg | ORAL_CAPSULE | Freq: Three times a day (TID) | ORAL | 0 refills | Status: DC

## 2019-03-05 NOTE — Discharge Instructions (Addendum)
Use tylenol for pain. Your COVID test is pending; you should expect results in 2-3 days. You can access your results on your MyChart--if you test positive you should receive a phone call.  In the meantime follow CDC guidelines and quarantine, wear a mask, wash hands often.  Please take over the counter vitamin D 2000-4000 units per day. I have also recommend  zinc 50 mg per day for the next two weeks.   Please return to ED if you feel have difficulty breathing or have emergent, new or concerning symptoms.  Patients who have symptoms consistent with COVID-19 should self isolated for: At least 3 days (72 hours) have passed since recovery, defined as resolution of fever without the use of fever reducing medications and improvement in respiratory symptoms (e.g., cough, shortness of breath), and At least 7 days have passed since symptoms first appeared.       Person Under Monitoring Name: Joy Cruz  Location: [address] [place] [address]   Infection Prevention Recommendations for Individuals Confirmed to have, or Being Evaluated for, 2019 Novel Coronavirus (COVID-19) Infection Who Receive Care at Home  Individuals who are confirmed to have, or are being evaluated for, COVID-19 should follow the prevention steps below until a healthcare provider or local or state health department says they can return to normal activities.  Stay home except to get medical care You should restrict activities outside your home, except for getting medical care. Do not go to work, school, or public areas, and do not use public transportation or taxis.  Call ahead before visiting your doctor Before your medical appointment, call the healthcare provider and tell them that you have, or are being evaluated for, COVID-19 infection. This will help the healthcare providers office take steps to keep other people from getting infected. Ask your healthcare provider to call the local or state  health department.  Monitor your symptoms Seek prompt medical attention if your illness is worsening (e.g., difficulty breathing). Before going to your medical appointment, call the healthcare provider and tell them that you have, or are being evaluated for, COVID-19 infection. Ask your healthcare provider to call the local or state health department.  Wear a facemask You should wear a facemask that covers your nose and mouth when you are in the same room with other people and when you visit a healthcare provider. People who live with or visit you should also wear a facemask while they are in the same room with you.  Separate yourself from other people in your home As much as possible, you should stay in a different room from other people in your home. Also, you should use a separate bathroom, if available.  Avoid sharing household items You should not share dishes, drinking glasses, cups, eating utensils, towels, bedding, or other items with other people in your home. After using these items, you should wash them thoroughly with soap and water.  Cover your coughs and sneezes Cover your mouth and nose with a tissue when you cough or sneeze, or you can cough or sneeze into your sleeve. Throw used tissues in a lined trash can, and immediately wash your hands with soap and water for at least 20 seconds or use an alcohol-based hand rub.  Wash your [facility] your hands often and thoroughly with soap and water for at least 20 seconds. You can use an alcohol-based hand sanitizer if soap and water are not available and if your hands are not visibly  dirty. Avoid touching your eyes, nose, and mouth with unwashed hands.   Prevention Steps for Caregivers and Household Members of Individuals Confirmed to have, or Being Evaluated for, COVID-19 Infection Being Cared for in the Home  If you live with, or provide care at home for, a person confirmed to have, or being evaluated for, COVID-19  infection please follow these guidelines to prevent infection:  Follow healthcare providers instructions Make sure that you understand and can help the patient follow any healthcare provider instructions for all care.  Provide for the patients basic needs You should help the patient with basic needs in the home and provide support for getting groceries, prescriptions, and other personal needs.  Monitor the patients symptoms If they are getting sicker, call his or her medical provider and tell them that the patient has, or is being evaluated for, COVID-19 infection. This will help the healthcare providers office take steps to keep other people from getting infected. Ask the healthcare provider to call the local or state health department.  Limit the number of people who have contact with the patient If possible, have only one caregiver for the patient. Other household members should stay in another home or place of residence. If this is not possible, they should stay in another room, or be separated from the patient as much as possible. Use a separate bathroom, if available. Restrict visitors who do not have an essential need to be in the home.  Keep older adults, very young children, and other sick people away from the patient Keep older adults, very young children, and those who have compromised immune systems or chronic health conditions away from the patient. This includes people with chronic heart, lung, or kidney conditions, diabetes, and cancer.  Ensure good ventilation Make sure that shared spaces in the home have good air flow, such as from an air conditioner or an opened window, weather permitting.  Wash your hands often Wash your hands often and thoroughly with soap and water for at least 20 seconds. You can use an alcohol based hand sanitizer if soap and water are not available and if your hands are not visibly dirty. Avoid touching your eyes, nose, and mouth with  unwashed hands. Use disposable paper towels to dry your hands. If not available, use dedicated cloth towels and replace them when they become wet.  Wear a facemask and gloves Wear a disposable facemask at all times in the room and gloves when you touch or have contact with the patients blood, body fluids, and/or secretions or excretions, such as sweat, saliva, sputum, nasal mucus, vomit, urine, or feces.  Ensure the mask fits over your nose and mouth tightly, and do not touch it during use. Throw out disposable facemasks and gloves after using them. Do not reuse. Wash your hands immediately after removing your facemask and gloves. If your personal clothing becomes contaminated, carefully remove clothing and launder. Wash your hands after handling contaminated clothing. Place all used disposable facemasks, gloves, and other waste in a lined container before disposing them with other household waste. Remove gloves and wash your hands immediately after handling these items.  Do not share dishes, glasses, or other household items with the patient Avoid sharing household items. You should not share dishes, drinking glasses, cups, eating utensils, towels, bedding, or other items with a patient who is confirmed to have, or being evaluated for, COVID-19 infection. After the person uses these items, you should wash them thoroughly with soap and water.  Wash laundry thoroughly Immediately remove and wash clothes or bedding that have blood, body fluids, and/or secretions or excretions, such as sweat, saliva, sputum, nasal mucus, vomit, urine, or feces, on them. Wear gloves when handling laundry from the patient. Read and follow directions on labels of laundry or clothing items and detergent. In general, wash and dry with the warmest temperatures recommended on the label.  Clean all areas the individual has used often Clean all touchable surfaces, such as counters, tabletops, doorknobs, bathroom fixtures,  toilets, phones, keyboards, tablets, and bedside tables, every day. Also, clean any surfaces that may have blood, body fluids, and/or secretions or excretions on them. Wear gloves when cleaning surfaces the patient has come in contact with. Use a diluted bleach solution (e.g., dilute bleach with 1 part bleach and 10 parts water) or a household disinfectant with a label that says EPA-registered for coronaviruses. To make a bleach solution at home, add 1 tablespoon of bleach to 1 quart (4 cups) of water. For a larger supply, add  cup of bleach to 1 gallon (16 cups) of water. Read labels of cleaning products and follow recommendations provided on product labels. Labels contain instructions for safe and effective use of the cleaning product including precautions you should take when applying the product, such as wearing gloves or eye protection and making sure you have good ventilation during use of the product. Remove gloves and wash hands immediately after cleaning.  Monitor yourself for signs and symptoms of illness Caregivers and household members are considered close contacts, should monitor their health, and will be asked to limit movement outside of the home to the extent possible. Follow the monitoring steps for close contacts listed on the symptom monitoring form.   ? If you have additional questions, contact your local health department or call the epidemiologist on call at [phone] (available 24/7). ? This guidance is subject to change. For the most up-to-date guidance from [facility], please refer to their website: [email or web address]

## 2019-03-05 NOTE — ED Notes (Signed)
[place] PA at bedside 

## 2019-03-05 NOTE — ED Triage Notes (Signed)
Pt arrived ambulatory into ED POV CC productive cough SOB on excertion and possible COVID exposure 13 days ago at funeral event.   Hx Type 2 diabetes, Anemia  Temp 99.6

## 2019-03-05 NOTE — ED Provider Notes (Signed)
[facility] DEPT Provider Note   CSN: [id number] Arrival date & time: 03/05/19  1619     History   Chief Complaint No chief complaint on file.   HPI Joy Cruz is a 43 y.o. female hx of chronic stable anemia and DMII well controlled with A1C 5.9     HPI  Patient presents for possible Covid exposure.  States that 11/6 she has known Covid exposure and started having symptoms include cough, shortness of breath with exertion, sore throat, body aches and fatigue this past Friday.  Patient states that her symptoms have been stable, constant, unchanged, unimproved with over-the-counter cough medications.  Patient states she has used Tylenol intermittently but not consistently.  States shortness of breath is noticeable when she is walking upstairs or exerting herself.  Denies any chest pain associated with this.  States that she has felt warm but has not felt feverish.  Patient has history of anemia she is followed regularly for her and states that is stable when last checked in September.  It also gives a mild abdominal pain that is generalized without diarrhea or vomiting associated.  Patient states she has had decreased taste and smell for the past several days as well.  States her appetite is intact and she is able to drink fluids and is able to tolerate p.o. without difficulty.     Past Medical History:  Diagnosis Date   Anemia    Diabetes mellitus without complication ([place])     There are no active problems to display for this patient.   Past Surgical History:  Procedure Laterality Date   CESAREAN SECTION     TUBAL LIGATION       OB History   No obstetric history on file.      Home Medications    Prior to Admission medications   Medication Sig Start Date End Date Taking? Authorizing Provider  acetaminophen (TYLENOL) 500 MG tablet Take 1,000 mg by mouth every 6 (six) hours as needed for moderate pain or headache.    [provider]  IRON PO Take 1 tablet by mouth 2 (two) times daily.    [provider]  medroxyPROGESTERone (PROVERA) 10 MG tablet Take 2 tablets (20 mg total) by mouth daily. 03/31/18   Sloan Leiter, MD  metFORMIN (GLUCOPHAGE) 500 MG tablet Take 500 mg by mouth 2 (two) times daily with a meal.    [provider]  traMADol (ULTRAM) 50 MG tablet Take 1 tablet (50 mg total) by mouth every 6 (six) hours as needed. 04/29/18   Valarie Merino, MD    Family History No family history on file.  Social History Social History   Tobacco Use   Smoking status: Current Some Day Smoker   Smokeless tobacco: Never Used   Tobacco comment: ceremonial reasons  Substance Use Topics   Alcohol use: Never    Frequency: Never   Drug use: Never     Allergies   Patient has no known allergies.   Review of Systems Review of Systems  Constitutional: Negative for fever.  HENT: Negative for congestion.   Eyes: Negative for pain.  Respiratory: Positive for cough and shortness of breath.   Cardiovascular: Negative for chest pain and leg swelling.  Gastrointestinal: Positive for abdominal pain. Negative for vomiting.  Genitourinary: Negative for dysuria.  Musculoskeletal: Positive for myalgias. Negative for neck pain.  Skin: Negative for rash.  Neurological: Negative for dizziness and headaches.     Physical  Exam Updated Vital Signs BP 133/83    Pulse 89    Temp 99.6 F (37.6 C) (Oral)    Resp 18    LMP 02/17/2019    SpO2 98%   Physical Exam Vitals signs and nursing note reviewed.  Constitutional:      General: She is not in acute distress.    Appearance: Normal appearance. She is not ill-appearing.     Comments: Patient is well-appearing presents, 43 year old female appears stated age able to answer questions appropriately and follow commands.  HENT:     Head: Normocephalic and atraumatic.     Nose: Nose normal.     Mouth/Throat:     Mouth: Mucous membranes are moist.      Comments: Moderate posterior pharynx erythema with no tonsillar exudate or swelling.  No petechia.  Clear PND noted to the posterior pharynx. Eyes:     General: No scleral icterus.       Right eye: No discharge.        Left eye: No discharge.     Conjunctiva/sclera: Conjunctivae normal.  Neck:     Musculoskeletal: Normal range of motion.  Cardiovascular:     Rate and Rhythm: Normal rate and regular rhythm.     Pulses: Normal pulses.     Heart sounds: Normal heart sounds.  Pulmonary:     Effort: Pulmonary effort is normal. No respiratory distress.     Breath sounds: No stridor. No wheezing.     Comments: Speaking full sentences not short of breath oxygen saturation on room air is WNL Abdominal:     Palpations: Abdomen is soft.     Tenderness: There is no abdominal tenderness. There is no guarding.  Musculoskeletal:     Right lower leg: No edema.     Left lower leg: No edema.  Skin:    General: Skin is warm and dry.     Capillary Refill: Capillary refill takes less than 2 seconds.  Neurological:     Mental Status: She is alert and oriented to person, place, and time. Mental status is at baseline.  Psychiatric:        Mood and Affect: Mood normal.        Behavior: Behavior normal.      ED Treatments / Results  Labs (all labs ordered are listed, but only abnormal results are displayed) Labs Reviewed  SARS CORONAVIRUS 2 (TAT 6-24 HRS)    EKG None  Radiology No results found.  Procedures Procedures (including critical care time)  Medications Ordered in ED Medications  acetaminophen (TYLENOL) tablet 1,000 mg (has no administration in time range)     Initial Impression / Assessment and Plan / ED Course  I have reviewed the triage vital signs and the nursing notes.  Pertinent labs & imaging results that were available during my care of the patient were reviewed by me and considered in my medical decision making (see chart for details).        Patient  presents for likely COVID-19 infection differential includes other viral illnesses, pneumonia, bronchitis, upper respiratory tract infection.  Doubt acute abdominal source of presentation today as patient does not have any tenderness to palpation, guarding, abdominal symptoms other abdominal discomfort is generalized.  Doubt strep as patient has a low Centor score and has associated cough with no tonsillar exudate or swelling.  Likely sore throat is viral in nature.  Patient appears to be tolerating room air well and is not desaturating.  Will obtain chest  x-ray, ambulate patient on room air with pulse oximetry, provide Tylenol, and test for coronavirus.  X-ray with patchy bilateral infiltrates suspicious for Covid pneumonia.  Patient has no fever at this time and lung sounds generally clear.  Doubt bacterial pneumonia.  Patient given instructions to return if she has difficulty breathing at rest or shortness of breath or new or concerning symptoms.  Patient able to ambulate on pulse ox SPO2 dropped to 92% however she is asymptomatic nursing staff.  Decision making discussion with patient over admission versus discharge.  Patient states that she is comfortable discharge is fine and has good support system at home with husband.   Vitals within normal limits at this time.  On reassessment patient continues to feel well.   Joy Cruz was evaluated in Emergency Department on 03/05/2019 for the symptoms described in the history of present illness. She was evaluated in the context of the global COVID-19 pandemic, which necessitated consideration that the patient might be at risk for infection with the SARS-CoV-2 virus that causes COVID-19. Institutional protocols and algorithms that pertain to the evaluation of patients at risk for COVID-19 are in a state of rapid change based on information released by regulatory bodies including the CDC and federal and state organizations. These policies and  algorithms were followed during the patient's care in the ED.  Patient given benzonatate and cough syrup for nighttime use.  Recommended Tylenol.  Recommended zinc and vitamin D.  This patient appears reasonably screened and I doubt any other medical condition requiring further workup, evaluation, or treatment in the ED at this time prior to discharge.   Patient's vitals are WNL apart from vital sign abnormalities discussed above, patient is in NAD, and able to ambulate in the ED at their baseline. Pain has been managed or a plan has been made for home management and has no complaints prior to discharge. Patient is comfortable with above plan and is stable for discharge at this time. All questions were answered prior to disposition. Results from the ER workup discussed with the patient face to face and all questions answered to the best of my ability. The patient is safe for discharge with strict return precautions. Patient appears safe for discharge with appropriate follow-up. Conveyed my impression with the patient and they voiced understanding and are agreeable to plan.   An After Visit Summary was printed and given to the patient.  Portions of this note were generated with Scientist, clinical (histocompatibility and immunogenetics)Dragon dictation software. Dictation errors may occur despite best attempts at proofreading.     Final Clinical Impressions(s) / ED Diagnoses   Final diagnoses:  Suspected COVID-19 virus infection  Viral illness    ED Discharge Orders    None       Gailen ShelterFondaw, Demetrius Mahler S, [place] 03/05/19 1906    Maia PlanLong, Joshua G, MD 03/06/19 1128

## 2019-03-05 NOTE — ED Notes (Signed)
PT ambulated around room. On excertion o2 sats dropped to 92% and upon rest returned to 99% on room air

## 2019-03-05 NOTE — ED Notes (Signed)
Pt verbalizes understanding of DC instructions. Pt belongings returned and is ambulatory out of ED.  

## 2019-03-06 LAB — SARS CORONAVIRUS 2 (TAT 6-24 HRS): SARS Coronavirus 2: POSITIVE — AB

## 2020-01-26 ENCOUNTER — Emergency Department (HOSPITAL_COMMUNITY)
Admission: EM | Admit: 2020-01-26 | Discharge: 2020-01-26 | Disposition: A | Discharge status: Home / Self Care | Payer: Self-pay | Attending: Emergency Medicine | Admitting: Emergency Medicine

## 2020-01-26 ENCOUNTER — Other Ambulatory Visit: Payer: Self-pay

## 2020-01-26 ENCOUNTER — Encounter (HOSPITAL_COMMUNITY): Payer: Self-pay

## 2020-01-26 ENCOUNTER — Emergency Department (HOSPITAL_COMMUNITY): Payer: Self-pay

## 2020-01-26 DIAGNOSIS — F17290 Nicotine dependence, other tobacco product, uncomplicated: Secondary | ICD-10-CM | POA: Insufficient documentation

## 2020-01-26 DIAGNOSIS — E119 Type 2 diabetes mellitus without complications: Secondary | ICD-10-CM | POA: Insufficient documentation

## 2020-01-26 DIAGNOSIS — S61251A Open bite of left index finger without damage to nail, initial encounter: Secondary | ICD-10-CM | POA: Insufficient documentation

## 2020-01-26 DIAGNOSIS — S61253A Open bite of left middle finger without damage to nail, initial encounter: Secondary | ICD-10-CM | POA: Insufficient documentation

## 2020-01-26 DIAGNOSIS — S6992XA Unspecified injury of left wrist, hand and finger(s), initial encounter: Secondary | ICD-10-CM

## 2020-01-26 DIAGNOSIS — Z7984 Long term (current) use of oral hypoglycemic drugs: Secondary | ICD-10-CM | POA: Insufficient documentation

## 2020-01-26 DIAGNOSIS — M79645 Pain in left finger(s): Secondary | ICD-10-CM
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
## 2020-01-26 NOTE — Discharge Instructions (Signed)
X-ray of your left hand/fingers did not reveal acute bony abnormalities  Suspect symptoms are from some type of soft tissue injury or inflammation  Continue to alternate ibuprofen and acetaminophen as needed for pain, ice.  Follow-up with primary care doctor for ongoing pain.  You may call hand specialist for specialized evaluation.  Return to the ED for any other symptoms or concerns

## 2020-01-26 NOTE — ED Notes (Signed)
An After Visit Summary was printed and given to the patient. Discharge instructions given and no further questions at this time.  
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
## 2020-05-12 ENCOUNTER — Encounter (HOSPITAL_COMMUNITY): Payer: Self-pay

## 2020-05-12 ENCOUNTER — Emergency Department (HOSPITAL_COMMUNITY): Payer: Self-pay

## 2020-05-12 ENCOUNTER — Other Ambulatory Visit: Payer: Self-pay

## 2020-05-12 DIAGNOSIS — Z7984 Long term (current) use of oral hypoglycemic drugs: Secondary | ICD-10-CM | POA: Diagnosis not present

## 2020-05-12 DIAGNOSIS — E119 Type 2 diabetes mellitus without complications: Secondary | ICD-10-CM | POA: Insufficient documentation

## 2020-05-12 DIAGNOSIS — F17290 Nicotine dependence, other tobacco product, uncomplicated: Secondary | ICD-10-CM | POA: Diagnosis not present

## 2020-05-12 DIAGNOSIS — R059 Cough, unspecified: Secondary | ICD-10-CM | POA: Insufficient documentation

## 2020-05-12 DIAGNOSIS — Z20822 Contact with and (suspected) exposure to covid-19: Secondary | ICD-10-CM | POA: Diagnosis not present

## 2020-05-12 NOTE — ED Triage Notes (Addendum)
Pt c/o productive cough x 2 wks, denies fevers, states she has taken mucinex but the cough has persisted. Has not had a covid test, states laying down makes it hard to breathe

## 2020-05-13 ENCOUNTER — Emergency Department (HOSPITAL_COMMUNITY)
Admission: EM | Admit: 2020-05-13 | Discharge: 2020-05-13 | Disposition: A | Discharge status: Home / Self Care | Payer: Self-pay | Attending: Emergency Medicine | Admitting: Emergency Medicine

## 2020-05-13 DIAGNOSIS — R059 Cough, unspecified: Secondary | ICD-10-CM

## 2020-05-13 LAB — SARS CORONAVIRUS 2 (TAT 6-24 HRS): SARS Coronavirus 2: NEGATIVE

## 2020-05-13 MED ORDER — AZITHROMYCIN 250 MG PO TABS: 250.0000 mg | ORAL_TABLET | Freq: Every day | ORAL | 0 refills | Status: DC

## 2020-05-13 MED ORDER — BENZONATATE 100 MG PO CAPS: 100.0000 mg | ORAL_CAPSULE | Freq: Three times a day (TID) | ORAL | 0 refills | Status: DC

## 2020-05-13 NOTE — ED Notes (Signed)
Discharged no concerns at this time.  °

## 2020-05-13 NOTE — ED Provider Notes (Signed)
[facility]-EMERGENCY DEPT Provider Note   CSN: [id number] Arrival date & time: 05/12/20  2332     History Chief Complaint  Patient presents with  . Cough    Joy Cruz is a 45 y.o. female.  Patient presents to the emergency department with a chief complaint of cough x2 weeks.  She reports productive cough.  States that sputum was clear, but just changed to green over the past day or so.  She has not been tested for Covid.  She has been vaccinated.  She states that her symptoms are worse when she lies down.  She has tried taking Mucinex with little relief.  She denies any fever.  The history is provided by the patient. No language interpreter was used.       Past Medical History:  Diagnosis Date  . Anemia   . Diabetes mellitus without complication ([facility])     There are no problems to display for this patient.   Past Surgical History:  Procedure Laterality Date  . CESAREAN SECTION    . TUBAL LIGATION       OB History   No obstetric history on file.     History reviewed. No pertinent family history.  Social History   Tobacco Use  . Smoking status: Current Some Day Smoker    Types: Pipe  . Smokeless tobacco: Never Used  . Tobacco comment: ceremonial reasons (only in July)  Vaping Use  . Vaping Use: Never used  Substance Use Topics  . Alcohol use: Never  . Drug use: Never    Home Medications Prior to Admission medications   Medication Sig Start Date End Date Taking? Authorizing Provider  azithromycin (ZITHROMAX) 250 MG tablet Take 1 tablet (250 mg total) by mouth daily. Take first 2 tablets together, then 1 every day until finished. 05/13/20  Yes Roxy Horseman, PA-C  benzonatate (TESSALON) 100 MG capsule Take 1 capsule (100 mg total) by mouth every 8 (eight) hours. 05/13/20  Yes Roxy Horseman, PA-C  acetaminophen (TYLENOL) 500 MG tablet Take 1,000 mg by mouth every 6 (six) hours as needed for moderate pain or headache.    [provider]  IRON PO Take 1 tablet by mouth 2 (two) times daily.    [provider]  medroxyPROGESTERone (PROVERA) 10 MG tablet Take 2 tablets (20 mg total) by mouth daily. 03/31/18   Conan Bowens, MD  metFORMIN (GLUCOPHAGE) 500 MG tablet Take 500 mg by mouth 2 (two) times daily with a meal.    [provider]    Allergies    Patient has no known allergies.  Review of Systems   Review of Systems  All other systems reviewed and are negative.   Physical Exam Updated Vital Signs BP (!) 171/102   Pulse 85   Temp 98.4 F (36.9 C)   Resp 16   Ht 5\' 8"  (1.727 m)   Wt 127 kg   LMP 05/11/2020   SpO2 99%   BMI 42.57 kg/m   Physical Exam Vitals and nursing note reviewed.  Constitutional:      General: She is not in acute distress.    Appearance: She is well-developed and well-nourished.  HENT:     Head: Normocephalic and atraumatic.  Eyes:     Conjunctiva/sclera: Conjunctivae normal.  Cardiovascular:     Rate and Rhythm: Normal rate and regular rhythm.     Heart sounds: No murmur heard.   Pulmonary:     Effort: Pulmonary  effort is normal. No respiratory distress.     Breath sounds: Normal breath sounds.  Abdominal:     Palpations: Abdomen is soft.     Tenderness: There is no abdominal tenderness.  Musculoskeletal:        General: No edema. Normal range of motion.     Cervical back: Neck supple.  Skin:    General: Skin is warm and dry.  Neurological:     Mental Status: She is alert and oriented to person, place, and time.  Psychiatric:        Mood and Affect: Mood and affect and mood normal.        Behavior: Behavior normal.     ED Results / Procedures / Treatments   Labs (all labs ordered are listed, but only abnormal results are displayed) Labs Reviewed  SARS CORONAVIRUS 2 (TAT 6-24 HRS)    EKG None  Radiology DG Chest 2 View  Result Date: 05/13/2020 CLINICAL DATA:  Productive cough for 2 weeks.  Shortness of breath. EXAM: CHEST -  2 VIEW COMPARISON:  03/05/2019 FINDINGS: Heart size is upper limits of normal. Perihilar and peribronchial thickening with slight interstitial pattern to the lungs. This could represent bronchitis or early multifocal pneumonia. No focal consolidation. No pleural effusions. No pneumothorax. Mediastinal contours appear intact. IMPRESSION: Perihilar and peribronchial thickening with slight interstitial pattern to the lungs. This could represent bronchitis or early multifocal pneumonia. Electronically Signed   By: Burman Nieves M.D.   On: 05/13/2020 00:02    Procedures Procedures   Medications Ordered in ED Medications - No data to display  ED Course  I have reviewed the triage vital signs and the nursing notes.  Pertinent labs & imaging results that were available during my care of the patient were reviewed by me and considered in my medical decision making (see chart for details).    MDM Rules/Calculators/A&P                         Patient here with cough x2 weeks.  Sputum changed colors from clear to green yesterday.  Denies any fevers.  Symptoms could be Covid, but with her symptoms having started 2 weeks ago, she is out of the window for Mab therapy.  I question whether she has post Covid pneumonia.  Will treat with azithromycin.  Chest x-ray shows possible early multifocal pneumonia versus bronchitis.  We will also discharged home with [facility].  Her O2 saturations normal.  She is not tachycardic.  She appears in no acute distress.   Final Clinical Impression(s) / ED Diagnoses Final diagnoses:  Cough    Rx / DC Orders ED Discharge Orders         Ordered    benzonatate (TESSALON) 100 MG capsule  Every 8 hours        05/13/20 0243    azithromycin (ZITHROMAX) 250 MG tablet  Daily        05/13/20 0243           Roxy Horseman, PA-C 05/13/20 0247    Palumbo, Namine, MD 05/13/20 [phone]

## 2020-05-13 NOTE — Discharge Instructions (Signed)
Your current symptoms could be consistent with the coronavirus.    Please quarantine yourself while awaiting your test results. Please stay home for a minimum of 10 days from the first day of illness with improving symptoms and you have had 24 hours of no fever (without the use of Tylenol (Acetaminophen) Motrin (Ibuprofen) or any fever reducing medication).  Also - Do not get tested prior to returning to work because once you have had a positive test the test can stay positive for more than a month in some cases.   You should wear a mask or cloth face covering over your nose and mouth if you must be around other people or animals, including pets (even at home). Try to stay at least 6 feet away from other people. This will protect the people around you.  Please continue good preventive care measures, including:  frequent hand-washing, avoid touching your face, cover coughs/sneezes, stay out of crowds and keep a 6 foot distance from others.  COVID-19 is a respiratory illness with symptoms that are similar to the flu. Symptoms are typically mild to moderate, but there have been cases of severe illness and death due to the virus.   The following symptoms may appear 2-14 days after exposure: Fever Cough Shortness of breath or difficulty breathing Chills Repeated shaking with chills Muscle pain Headache Sore throat New loss of taste or smell Fatigue Congestion or runny nose Nausea or vomiting Diarrhea  Go to the nearest hospital ED for assessment if fever/cough/breathlessness are severe or illness seems like a threat to life.  It is vitally important that if you feel that you have an infection such as this virus or any other virus that you stay home and away from places where you may spread it to others.  You should avoid contact with people age 82 and older.    You may also take acetaminophen (Tylenol) as needed for fever.  Reduce your risk of any infection by using the same precautions used for  avoiding the common cold or flu:  Wash your hands often with soap and warm water for at least 20 seconds.  If soap and water are not readily available, use an alcohol-based hand sanitizer with at least 60% alcohol.  If coughing or sneezing, cover your mouth and nose by coughing or sneezing into the elbow areas of your shirt or coat, into a tissue or into your sleeve (not your hands). Avoid shaking hands with others and consider head nods or verbal greetings only. Avoid touching your eyes, nose, or mouth with unwashed hands.  Avoid close contact with people who are sick. Avoid places or events with large numbers of people in one location, like concerts or sporting events. Carefully consider travel plans you have or are making. If you are planning any travel outside or inside the [place], visit the CDC's Travelers' Health webpage for the latest health notices. If you have some symptoms but not all symptoms, continue to monitor at home and seek medical attention if your symptoms worsen. If you are having a medical emergency, call 911.  HOME CARE Only take medications as instructed by your medical team. Drink plenty of fluids and get plenty of rest. A steam or ultrasonic humidifier can help if you have congestion.   GET HELP RIGHT AWAY IF YOU HAVE EMERGENCY WARNING SIGNS** FOR COVID-19. If you or someone is showing any of these signs seek emergency medical care immediately. Call 911 or proceed to your closest emergency facility if:  You develop worsening high fever. Trouble breathing Bluish lips or face Persistent pain or pressure in the chest New confusion Inability to wake or stay awake You cough up blood. Your symptoms become more severe  **This list is not all possible symptoms. Contact your medical provider for any symptoms that are sever or concerning to you.  MAKE SURE YOU  Understand these instructions. Will watch your condition. Will get help right away if you are not doing well or get  worse.

## 2020-09-28 ENCOUNTER — Encounter (HOSPITAL_COMMUNITY): Payer: Self-pay | Admitting: *Deleted

## 2020-09-28 ENCOUNTER — Other Ambulatory Visit: Payer: Self-pay

## 2020-09-28 ENCOUNTER — Emergency Department (HOSPITAL_COMMUNITY)
Admission: EM | Admit: 2020-09-28 | Discharge: 2020-09-28 | Disposition: A | Discharge status: Home / Self Care | Payer: Self-pay | Attending: Emergency Medicine | Admitting: Emergency Medicine

## 2020-09-28 ENCOUNTER — Emergency Department (HOSPITAL_COMMUNITY): Payer: Self-pay

## 2020-09-28 DIAGNOSIS — Z7984 Long term (current) use of oral hypoglycemic drugs: Secondary | ICD-10-CM | POA: Insufficient documentation

## 2020-09-28 DIAGNOSIS — Z20822 Contact with and (suspected) exposure to covid-19: Secondary | ICD-10-CM | POA: Insufficient documentation

## 2020-09-28 DIAGNOSIS — E119 Type 2 diabetes mellitus without complications: Secondary | ICD-10-CM | POA: Insufficient documentation

## 2020-09-28 DIAGNOSIS — J069 Acute upper respiratory infection, unspecified: Secondary | ICD-10-CM | POA: Insufficient documentation

## 2020-09-28 DIAGNOSIS — F17290 Nicotine dependence, other tobacco product, uncomplicated: Secondary | ICD-10-CM | POA: Insufficient documentation

## 2020-09-28 LAB — RESP PANEL BY RT-PCR (FLU A&B, COVID) ARPGX2
Influenza A by PCR: NEGATIVE
Influenza B by PCR: NEGATIVE
SARS Coronavirus 2 by RT PCR: NEGATIVE

## 2020-09-28 MED ORDER — BENZONATATE 100 MG PO CAPS: 100.0000 mg | ORAL_CAPSULE | Freq: Three times a day (TID) | ORAL | 0 refills | Status: DC | PRN

## 2020-09-28 NOTE — Discharge Instructions (Addendum)
Like we discussed, please continue taking Tylenol/ibuprofen as needed for management of your headaches and sore throat.  Please follow the instructions on the bottles.  I am going to prescribe you a medication called Tessalon Perles.  This is a cough medication you can take up to 3 times a day for management of your cough.  Please continue to drink warm tea with honey for sore throat and consider trying a Nettie pot for your sinus congestion.  If you develop any new or worsening symptoms please come back to the emergency department.  It was a pleasure to meet you.

## 2020-09-28 NOTE — ED Triage Notes (Signed)
Pt complains of cough, chest congestion, headache, sore throat, fever x 2 days.

## 2020-09-28 NOTE — ED Provider Notes (Signed)
Emergency Medicine Provider Triage Evaluation Note  Joy Cruz , a 45 y.o. female  was evaluated in triage.  Pt complains of cough, subjective fever, occasional shortness of breath, sore throat.  Review of Systems  Positive: Cough, fever, shortness of breath, sore throat Negative: Vomiting, diarrhea  Physical Exam  BP (!) 144/93 (BP Location: Left Arm)   Pulse 76   Temp 98.5 F (36.9 C) (Oral)   Resp 18   SpO2 100%  Gen:   Awake, no distress   Resp:  Normal effort  MSK:   Moves extremities without difficulty  Other:    Medical Decision Making  Medically screening exam initiated at 2:22 PM.  Appropriate orders placed.  Joy Cruz was informed that the remainder of the evaluation will be completed by another provider, this initial triage assessment does not replace that evaluation, and the importance of remaining in the ED until their evaluation is complete.     Joy Pancoast, PA-C 09/28/20 1607    Joy Laine, MD 09/29/20 1700

## 2020-09-28 NOTE — ED Provider Notes (Signed)
[facility]-EMERGENCY DEPT Provider Note   CSN: [id number] Arrival date & time: 09/28/20  1402     History Chief Complaint  Patient presents with   Fever   Cough   Headache    Joy Cruz is a 45 y.o. female.  HPI Patient is a 45 year old female with a medical history as noted below.  She presented to the emergency department due to a productive cough with green sputum, subjective fevers, intermittent shortness of breath, intermittent headaches, sore throat, as well as chest pain with coughing.  Symptoms started about 4 days ago.  States she has been taking Mucinex with intermittent relief.  Denies any vomiting or diarrhea.    Past Medical History:  Diagnosis Date   Anemia    Diabetes mellitus without complication (HCC)     There are no problems to display for this patient.   Past Surgical History:  Procedure Laterality Date   CESAREAN SECTION     TUBAL LIGATION       OB History   No obstetric history on file.     No family history on file.  Social History   Tobacco Use   Smoking status: Some Days    Pack years: 0.00    Types: Pipe   Smokeless tobacco: Never   Tobacco comments:    ceremonial reasons (only in July)  Vaping Use   Vaping Use: Never used  Substance Use Topics   Alcohol use: Never   Drug use: Never    Home Medications Prior to Admission medications   Medication Sig Start Date End Date Taking? Authorizing Provider  benzonatate (TESSALON) 100 MG capsule Take 1 capsule (100 mg total) by mouth 3 (three) times daily as needed for cough. 09/28/20  Yes Placido Sou, PA-C  acetaminophen (TYLENOL) 500 MG tablet Take 1,000 mg by mouth every 6 (six) hours as needed for moderate pain or headache.    [provider]  azithromycin (ZITHROMAX) 250 MG tablet Take 1 tablet (250 mg total) by mouth daily. Take first 2 tablets together, then 1 every day until finished. 05/13/20   Roxy Horseman, PA-C  IRON PO Take 1 tablet  by mouth 2 (two) times daily.    [provider]  medroxyPROGESTERone (PROVERA) 10 MG tablet Take 2 tablets (20 mg total) by mouth daily. 03/31/18   Conan Bowens, MD  metFORMIN (GLUCOPHAGE) 500 MG tablet Take 500 mg by mouth 2 (two) times daily with a meal.    [provider]    Allergies    Patient has no known allergies.  Review of Systems   Review of Systems  Constitutional:  Positive for fever.  HENT:  Positive for congestion, rhinorrhea, sneezing and sore throat.   Respiratory:  Positive for cough. Negative for shortness of breath.   Cardiovascular:  Negative for chest pain.  Gastrointestinal:  Negative for diarrhea and vomiting.  Neurological:  Positive for headaches.   Physical Exam Updated Vital Signs BP 120/74 (BP Location: Left Arm)   Pulse 77   Temp 98.5 F (36.9 C) (Oral)   Resp 16   SpO2 98%   Physical Exam Vitals and nursing note reviewed.  Constitutional:      General: She is not in acute distress.    Appearance: Normal appearance. She is not ill-appearing, toxic-appearing or diaphoretic.  HENT:     Head: Normocephalic and atraumatic.     Comments: Uvula midline.  No significant erythema noted in the posterior oropharynx.  No exudates.  Readily handling secretions.  Speaking clearly and coherently.  No stridor.    Right Ear: External ear normal.     Left Ear: External ear normal.     Nose: Nose normal.     Mouth/Throat:     Mouth: Mucous membranes are moist.     Pharynx: Oropharynx is clear. No oropharyngeal exudate or posterior oropharyngeal erythema.  Eyes:     Extraocular Movements: Extraocular movements intact.  Cardiovascular:     Rate and Rhythm: Normal rate and regular rhythm.     Pulses: Normal pulses.     Heart sounds: Normal heart sounds. No murmur heard.   No friction rub. No gallop.  Pulmonary:     Effort: Pulmonary effort is normal. No respiratory distress.     Breath sounds: Normal breath sounds. No stridor. No  wheezing, rhonchi or rales.     Comments: LCTAB Abdominal:     General: Abdomen is flat.     Tenderness: There is no abdominal tenderness.  Musculoskeletal:        General: Normal range of motion.     Cervical back: Normal range of motion and neck supple. No tenderness.  Skin:    General: Skin is warm and dry.  Neurological:     General: No focal deficit present.     Mental Status: She is alert and oriented to person, place, and time.     GCS: GCS eye subscore is 4. GCS verbal subscore is 5. GCS motor subscore is 6.  Psychiatric:        Mood and Affect: Mood normal.        Behavior: Behavior normal.   ED Results / Procedures / Treatments   Labs (all labs ordered are listed, but only abnormal results are displayed) Labs Reviewed  RESP PANEL BY RT-PCR (FLU A&B, COVID) ARPGX2   EKG None  Radiology DG Chest 2 View  Result Date: 09/28/2020 CLINICAL DATA:  Shortness of breath. EXAM: CHEST - 2 VIEW COMPARISON:  May 12, 2020 FINDINGS: Cardiomediastinal silhouette is normal. Mediastinal contours appear intact. Tortuosity of the aorta. There is no evidence of focal airspace consolidation, pleural effusion or pneumothorax. Osseous structures are without acute abnormality. Soft tissues are grossly normal. IMPRESSION: No active cardiopulmonary disease. Electronically Signed   By: Ted Mcalpine M.D.   On: 09/28/2020 14:54    Procedures Procedures   Medications Ordered in ED Medications - No data to display  ED Course  I have reviewed the triage vital signs and the nursing notes.  Pertinent labs & imaging results that were available during my care of the patient were reviewed by me and considered in my medical decision making (see chart for details).    MDM Rules/Calculators/A&P                          Patient is a 45 year old female that presents to the emergency department with symptoms that are consistent with a viral URI.  Physical exam is extremely reassuring.  Heart  is regular rate and rhythm without murmurs, rubs, or gallops.  Lungs are clear to auscultation bilaterally.  Oxygen saturations in the high 90s on room air.  Normothermic.  Respiratory panel is negative for flu as well as COVID-19.  Chest x-ray is negative.  Will discharge patient on a course of Tessalon Perles for her cough.  Discussed multiple OTC medications for treatment of her symptoms.  Feel that she is stable for discharge at this time  and she is agreeable.  Given a work note.  Discussed return precautions.  Her questions were answered and she was amicable at the time of discharge.  Final Clinical Impression(s) / ED Diagnoses Final diagnoses:  Viral URI with cough    Rx / DC Orders ED Discharge Orders          Ordered    benzonatate (TESSALON) 100 MG capsule  3 times daily PRN        09/28/20 2001             Placido Sou, PA-C 09/28/20 2005    [name], MD 09/29/20 1700
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
## 2021-10-14 IMAGING — CR DG CHEST 2V
2 series · 2 of 2 positions shown · non-contrast
Comparison: May 12, 2020

CLINICAL DATA: Shortness of breath.

EXAM:
CHEST - 2 VIEW

[w chest pa]
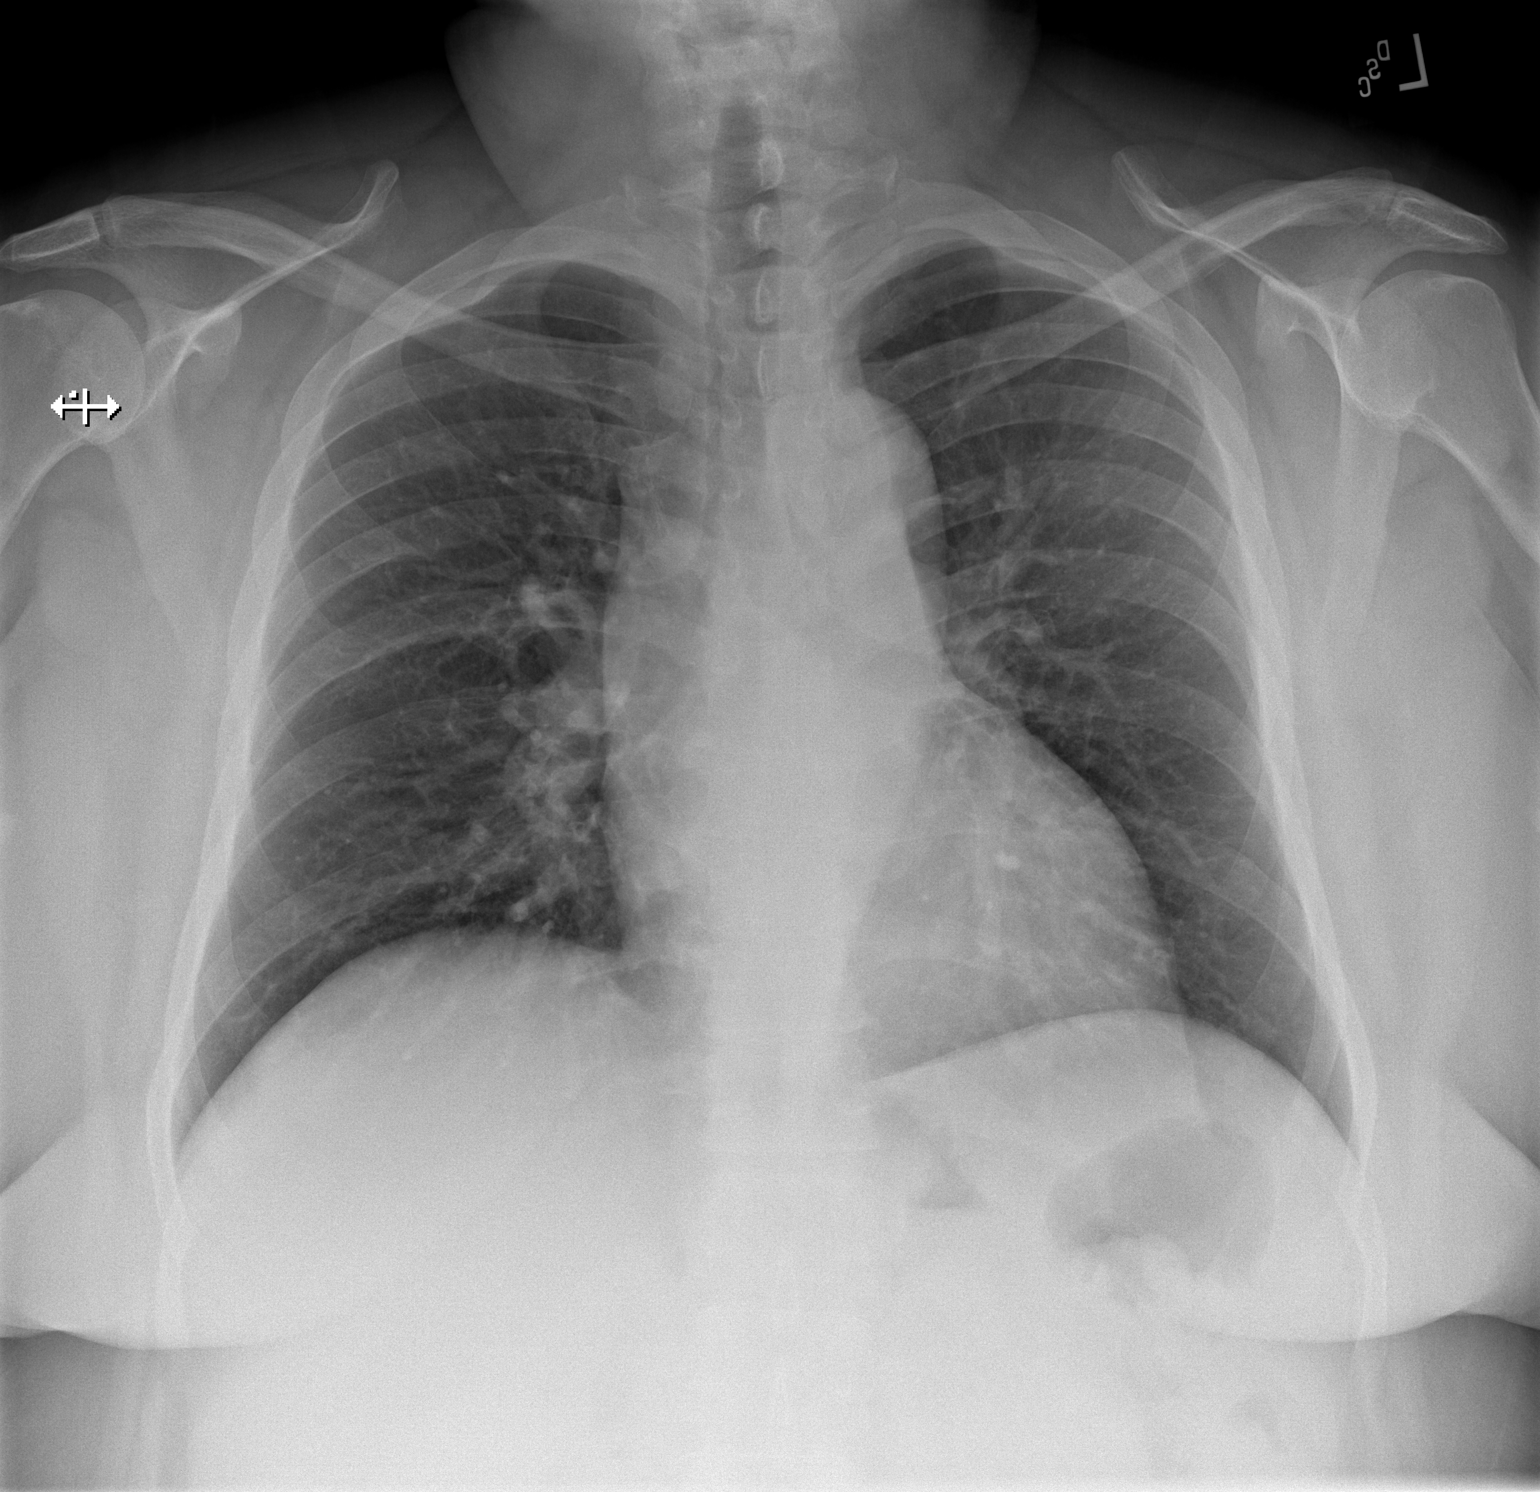

[w chest lat]
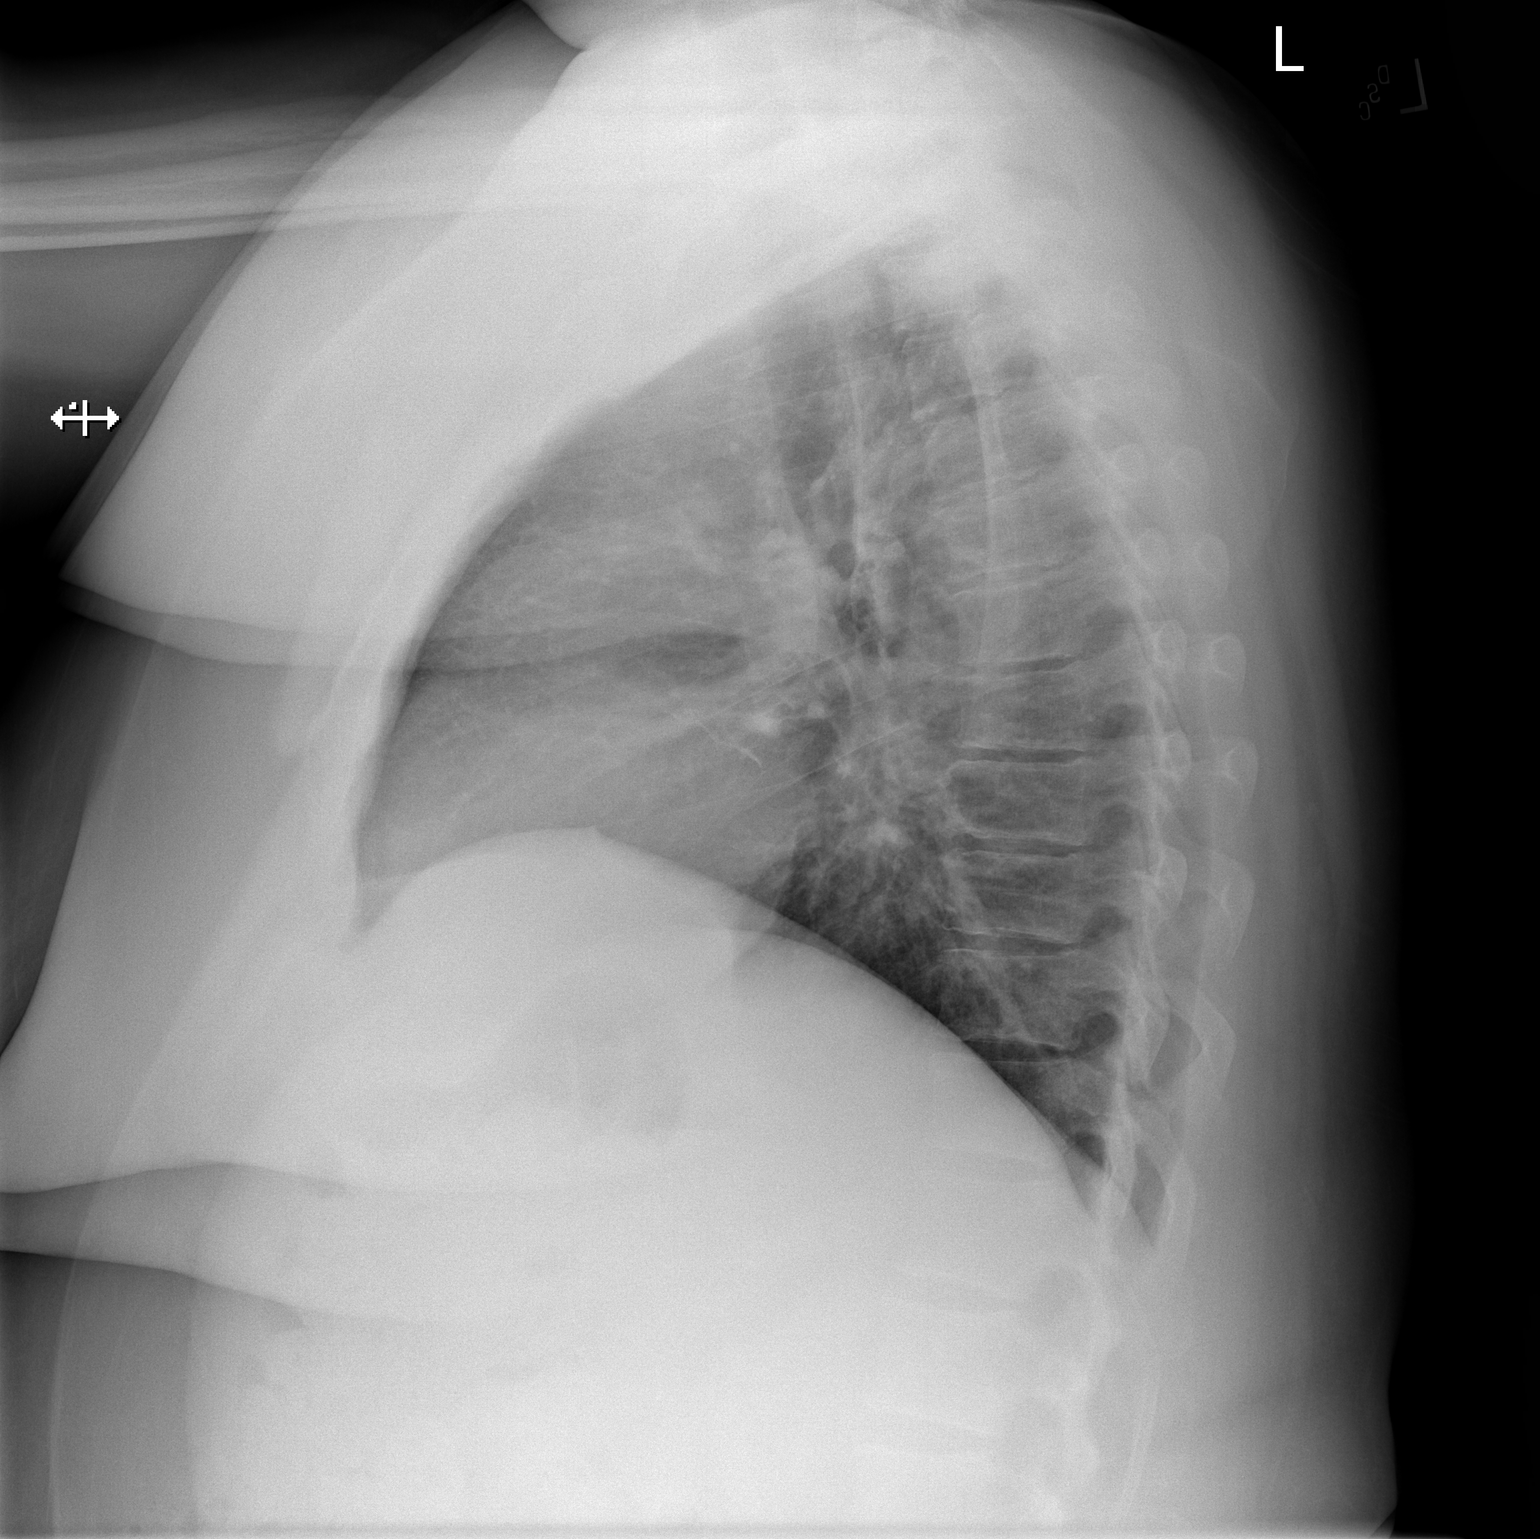

[2 of 2 positions shown; findings below may reference images not displayed]

FINDINGS: Cardiomediastinal silhouette is normal. Mediastinal contours appear
intact. Tortuosity of the aorta.

There is no evidence of focal airspace consolidation, pleural
effusion or pneumothorax.

Osseous structures are without acute abnormality. Soft tissues are
grossly normal.
IMPRESSION: No active cardiopulmonary disease.

## 2022-03-27 ENCOUNTER — Emergency Department (HOSPITAL_COMMUNITY): Payer: PRIVATE HEALTH INSURANCE

## 2022-03-27 ENCOUNTER — Other Ambulatory Visit: Payer: Self-pay

## 2022-03-27 ENCOUNTER — Emergency Department (HOSPITAL_COMMUNITY)
Admission: EM | Admit: 2022-03-27 | Discharge: 2022-03-27 | Disposition: A | Discharge status: Home / Self Care | Payer: PRIVATE HEALTH INSURANCE | Attending: Emergency Medicine | Admitting: Emergency Medicine

## 2022-03-27 ENCOUNTER — Encounter (HOSPITAL_COMMUNITY): Payer: Self-pay | Admitting: Emergency Medicine

## 2022-03-27 DIAGNOSIS — E119 Type 2 diabetes mellitus without complications: Secondary | ICD-10-CM | POA: Insufficient documentation

## 2022-03-27 DIAGNOSIS — J189 Pneumonia, unspecified organism: Secondary | ICD-10-CM

## 2022-03-27 DIAGNOSIS — R059 Cough, unspecified: Secondary | ICD-10-CM | POA: Insufficient documentation

## 2022-03-27 DIAGNOSIS — Z7984 Long term (current) use of oral hypoglycemic drugs: Secondary | ICD-10-CM | POA: Insufficient documentation

## 2022-03-27 DIAGNOSIS — Z20822 Contact with and (suspected) exposure to covid-19: Secondary | ICD-10-CM | POA: Insufficient documentation

## 2022-03-27 LAB — I-STAT CHEM 8, ED
BUN: 11 mg/dL (ref 6–20)
Calcium, Ion: 1.09 mmol/L — ABNORMAL LOW (ref 1.15–1.40)
Chloride: 105 mmol/L (ref 98–111)
Creatinine, Ser: 0.6 mg/dL (ref 0.44–1.00)
Glucose, Bld: 106 mg/dL — ABNORMAL HIGH (ref 70–99)
HCT: 25 % — ABNORMAL LOW (ref 36.0–46.0)
Hemoglobin: 8.5 g/dL — ABNORMAL LOW (ref 12.0–15.0)
Potassium: 3.7 mmol/L (ref 3.5–5.1)
Sodium: 138 mmol/L (ref 135–145)
TCO2: 20 mmol/L — ABNORMAL LOW (ref 22–32)

## 2022-03-27 LAB — CBC
HCT: 28.6 % — ABNORMAL LOW (ref 36.0–46.0)
Hemoglobin: 8 g/dL — ABNORMAL LOW (ref 12.0–15.0)
MCH: 18.1 pg — ABNORMAL LOW (ref 26.0–34.0)
MCHC: 28 g/dL — ABNORMAL LOW (ref 30.0–36.0)
MCV: 64.9 fL — ABNORMAL LOW (ref 80.0–100.0)
Platelets: 530 10*3/uL — ABNORMAL HIGH (ref 150–400)
RBC: 4.41 MIL/uL (ref 3.87–5.11)
RDW: 18.6 % — ABNORMAL HIGH (ref 11.5–15.5)
WBC: 5.7 10*3/uL (ref 4.0–10.5)
nRBC: 0 % (ref 0.0–0.2)

## 2022-03-27 LAB — RESP PANEL BY RT-PCR (RSV, FLU A&B, COVID)  RVPGX2
Influenza A by PCR: NEGATIVE
Influenza B by PCR: NEGATIVE
Resp Syncytial Virus by PCR: NEGATIVE
SARS Coronavirus 2 by RT PCR: NEGATIVE

## 2022-03-27 LAB — TROPONIN I (HIGH SENSITIVITY)
Troponin I (High Sensitivity): 2 ng/L (ref <18)
Troponin I (High Sensitivity): <2 ng/L (ref <18)

## 2022-03-27 MED ORDER — AMOXICILLIN 500 MG PO CAPS
1000.0000 mg | ORAL_CAPSULE | Freq: Once | ORAL | Status: AC
  Administered 2022-03-27: 1000 mg via ORAL
  Filled 2022-03-27: qty 2

## 2022-03-27 MED ORDER — ALBUTEROL SULFATE HFA 108 (90 BASE) MCG/ACT IN AERS
2.0000 | INHALATION_SPRAY | RESPIRATORY_TRACT | Status: DC
  Administered 2022-03-27: 2 via RESPIRATORY_TRACT
  Filled 2022-03-27: qty 6.7

## 2022-03-27 MED ORDER — DOXYCYCLINE HYCLATE 100 MG PO CAPS: 100.0000 mg | ORAL_CAPSULE | Freq: Two times a day (BID) | ORAL | 0 refills | Status: DC

## 2022-03-27 MED ORDER — AMOXICILLIN 500 MG PO CAPS: 1000.0000 mg | ORAL_CAPSULE | Freq: Two times a day (BID) | ORAL | 0 refills | Status: AC

## 2022-03-27 MED ORDER — DOXYCYCLINE HYCLATE 100 MG PO TABS
100.0000 mg | ORAL_TABLET | Freq: Once | ORAL | Status: AC
  Administered 2022-03-27: 100 mg via ORAL
  Filled 2022-03-27: qty 1

## 2022-03-27 MED ORDER — IOHEXOL 350 MG/ML SOLN
75.0000 mL | Freq: Once | INTRAVENOUS | Status: AC | PRN
  Administered 2022-03-27: 75 mL via INTRAVENOUS

## 2022-03-27 MED ORDER — IPRATROPIUM-ALBUTEROL 0.5-2.5 (3) MG/3ML IN SOLN
3.0000 mL | Freq: Once | RESPIRATORY_TRACT | Status: AC
  Administered 2022-03-27: 3 mL via RESPIRATORY_TRACT
  Filled 2022-03-27: qty 3

## 2022-03-27 MED ORDER — ACETAMINOPHEN 325 MG PO TABS
650.0000 mg | ORAL_TABLET | Freq: Once | ORAL | Status: AC
  Administered 2022-03-27: 650 mg via ORAL
  Filled 2022-03-27: qty 2

## 2022-03-27 NOTE — ED Notes (Signed)
Pt needs CMP or BMP for CT scan please

## 2022-03-27 NOTE — ED Provider Triage Note (Signed)
Emergency Medicine Provider Triage Evaluation Note  Joy Cruz , a 46 y.o. female  was evaluated in triage.  Pt complains of cough, muscle achesx3 days. Then woke up this AM had severe SOB, chest discomfort and tightness that radiated to back. Hx of HTN, DMII.  Review of Systems  Positive: Chest pain, SOB Negative: Fever, chills  Physical Exam  There were no vitals taken for this visit. Gen:   Awake, no distress   Resp:  Normal effort  MSK:   Moves extremities without difficulty  Other:  Chest wall ttp diffuse  Medical Decision Making  Medically screening exam initiated at 2:49 PM.  Appropriate orders placed.  Dennie Meddaugh was informed that the remainder of the evaluation will be completed by another provider, this initial triage assessment does not replace that evaluation, and the importance of remaining in the ED until their evaluation is complete.    Pete Pelt, [place] 03/27/22 1452
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
## 2022-03-27 NOTE — ED Provider Notes (Signed)
[facility] DEPT Provider Note   CSN: [id number] Arrival date & time: 03/27/22  1430     History  Chief Complaint  Patient presents with   Shortness of Breath    Joy Cruz is a 46 y.o. female.  46 year old female with history of diabetes presents with chest tightness which began after she coughed.  States that her cough has been productive of green sputum.  No fever or chills.  Does endorse myalgias.  Denies any sore throat or ear pain.  No vomiting or diarrhea.  Pain feels sharp and worse when she takes a deep breath.  Denies orthopnea or dyspnea exertion.  No pleurisy to it.  No new leg pain or swelling.       Home Medications Prior to Admission medications   Medication Sig Start Date End Date Taking? Authorizing Provider  acetaminophen (TYLENOL) 500 MG tablet Take 1,000 mg by mouth every 6 (six) hours as needed for moderate pain or headache.    [provider]  azithromycin (ZITHROMAX) 250 MG tablet Take 1 tablet (250 mg total) by mouth daily. Take first 2 tablets together, then 1 every day until finished. 05/13/20   Montine Circle, PA-C  benzonatate (TESSALON) 100 MG capsule Take 1 capsule (100 mg total) by mouth 3 (three) times daily as needed for cough. 09/28/20   Rayna Sexton, PA-C  IRON PO Take 1 tablet by mouth 2 (two) times daily.    [provider]  medroxyPROGESTERone (PROVERA) 10 MG tablet Take 2 tablets (20 mg total) by mouth daily. 03/31/18   Sloan Leiter, MD  metFORMIN (GLUCOPHAGE) 500 MG tablet Take 500 mg by mouth 2 (two) times daily with a meal.    [provider]      Allergies    Patient has no known allergies.    Review of Systems   Review of Systems  All other systems reviewed and are negative.   Physical Exam Updated Vital Signs BP (!) 144/95 (BP Location: Left Arm)   Pulse 88   Temp 98.8 F (37.1 C) (Oral)   Resp 18   SpO2 100%  Physical Exam Vitals and nursing note  reviewed.  Constitutional:      General: She is not in acute distress.    Appearance: Normal appearance. She is well-developed. She is not toxic-appearing.  HENT:     Head: Normocephalic and atraumatic.  Eyes:     General: Lids are normal.     Conjunctiva/sclera: Conjunctivae normal.     Pupils: Pupils are equal, round, and reactive to light.  Neck:     Thyroid: No thyroid mass.     Trachea: No tracheal deviation.  Cardiovascular:     Rate and Rhythm: Normal rate and regular rhythm.     Heart sounds: Normal heart sounds. No murmur heard.    No gallop.  Pulmonary:     Effort: Pulmonary effort is normal. No tachypnea or respiratory distress.     Breath sounds: Normal breath sounds. No stridor. No decreased breath sounds, wheezing, rhonchi or rales.  Abdominal:     General: There is no distension.     Palpations: Abdomen is soft.     Tenderness: There is no abdominal tenderness. There is no rebound.  Musculoskeletal:        General: No tenderness. Normal range of motion.     Cervical back: Normal range of motion and neck supple.  Skin:    General: Skin is warm and dry.  Findings: No abrasion or rash.  Neurological:     Mental Status: She is alert and oriented to person, place, and time. Mental status is at baseline.     GCS: GCS eye subscore is 4. GCS verbal subscore is 5. GCS motor subscore is 6.     Cranial Nerves: No cranial nerve deficit.     Sensory: No sensory deficit.     Motor: Motor function is intact.  Psychiatric:        Attention and Perception: Attention normal.        Speech: Speech normal.        Behavior: Behavior normal.     ED Results / Procedures / Treatments   Labs (all labs ordered are listed, but only abnormal results are displayed) Labs Reviewed  CBC - Abnormal; Notable for the following components:      Result Value   Hemoglobin 8.0 (*)    HCT 28.6 (*)    MCV 64.9 (*)    MCH 18.1 (*)    MCHC 28.0 (*)    RDW 18.6 (*)    Platelets 530 (*)     All other components within normal limits  RESP PANEL BY RT-PCR (RSV, FLU A&B, COVID)  RVPGX2  TROPONIN I (HIGH SENSITIVITY)  TROPONIN I (HIGH SENSITIVITY)    EKG EKG Interpretation  Date/Time:  Tuesday March 27 2022 14:47:53 EST Ventricular Rate:  78 PR Interval:  142 QRS Duration: 90 QT Interval:  356 QTC Calculation: 406 R Axis:   -6 Text Interpretation: Sinus rhythm Borderline T wave abnormalities Confirmed by Lorre Nick ([id number]) on 03/27/2022 4:35:17 PM  Radiology No results found.  Procedures Procedures    Medications Ordered in ED Medications  ipratropium-albuterol (DUONEB) 0.5-2.5 (3) MG/3ML nebulizer solution 3 mL (has no administration in time range)  acetaminophen (TYLENOL) tablet 650 mg (650 mg Oral Given 03/27/22 1540)    ED Course/ Medical Decision Making/ A&P                           Medical Decision Making Amount and/or Complexity of Data Reviewed Radiology: ordered.  Risk Prescription drug management.   Patient given albuterol nebulizer and does feel better at this time.  Concern for possible PE and chest CT was negative for pulmonary embolus but did show infection per my interpretation.  Will place patient on antibiotics for this and give first dose of antibiotics orally here.  Will also give albuterol inhaler to go home with.  Patient's vital signs stable.  Do not feel that she needs inpatient mission at this time.        Final Clinical Impression(s) / ED Diagnoses Final diagnoses:  None    Rx / DC Orders ED Discharge Orders     None         Lorre Nick, MD 03/27/22 2141

## 2023-01-04 ENCOUNTER — Encounter (HOSPITAL_COMMUNITY): Payer: Self-pay | Admitting: *Deleted

## 2023-01-04 ENCOUNTER — Other Ambulatory Visit: Payer: Self-pay

## 2023-01-04 ENCOUNTER — Emergency Department (HOSPITAL_COMMUNITY): Payer: 59

## 2023-01-04 ENCOUNTER — Emergency Department (HOSPITAL_COMMUNITY)
Admission: EM | Admit: 2023-01-04 | Discharge: 2023-01-05 | Discharge status: Against Medical Advice | Payer: 59 | Attending: Emergency Medicine | Admitting: Emergency Medicine

## 2023-01-04 DIAGNOSIS — M7989 Other specified soft tissue disorders: Secondary | ICD-10-CM | POA: Diagnosis not present

## 2023-01-04 DIAGNOSIS — Z5329 Procedure and treatment not carried out because of patient's decision for other reasons: Secondary | ICD-10-CM | POA: Insufficient documentation

## 2023-01-04 DIAGNOSIS — M1712 Unilateral primary osteoarthritis, left knee: Secondary | ICD-10-CM | POA: Diagnosis not present

## 2023-01-04 DIAGNOSIS — M25562 Pain in left knee: Secondary | ICD-10-CM | POA: Diagnosis not present
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
## 2023-01-05 NOTE — ED Provider Notes (Signed)
Patient eloped prior to my evaluation   Joy Cruz 01/05/23 2595    [name], MD 01/06/23 [phone]

## 2023-05-20 ENCOUNTER — Ambulatory Visit: Payer: Self-pay | Admitting: *Deleted

## 2023-05-20 NOTE — Telephone Encounter (Signed)
  Chief Complaint: Left knee pain Symptoms: Knee pain, swelling Frequency: Worsening for the last week Pertinent Negatives: Patient denies fever, redness Disposition: [] ED /[] Urgent Care (no appt availability in office) / [x] Appointment(In office/virtual)/ []  [facility]/ [] Home Care/ [] Refused Recommended Disposition /[] [facility]/ []  Follow-up with PCP Additional Notes: Pt reports left knee pain and swelling, she reports the original injury occurred in September, pt has been icing it and using OTC to help with pain/swelling since. Pt reports this time it has not resolved or improved like it has in the past. OV scheduled for tomorrow. This RN educated pt on home care, new-worsening symptoms, when to call back/seek emergent care. Pt verbalized understanding and agrees to plan.    Copied from CRM [id number]. Topic: Clinical - Red Word Triage >> May 20, 2023  2:21 PM Brandy W wrote: Red Word that prompted transfer to Nurse Triage: knee injury- left knee twisted moderate pain- Charlene - care guide for oscar and patient are both on the line, charlene states that [facility] is her PCP, however system shows no provider in system, so she is a new patient and decision tree denied scheduling for me due to red word Reason for Disposition  MILD or MODERATE swelling (e.g., can't move joint normally, can't do usual activities) (Exceptions: Itchy, localized swelling; swelling is chronic.)  Answer Assessment - Initial Assessment Questions 1. LOCATION: "Where is the swelling located?"  (e.g., left, right, both knees)     Left knee 2. ONSET: "When did the swelling start?" "Does it come and go, or is it there all the time?"     Initial injury was in September, worsening recently 3. SWELLING: "How bad is the swelling?" Or, "How large is it?" (e.g., mild, moderate, severe; size of localized swelling)    - NONE: No joint swelling.   - LOCALIZED: Localized; small area of puffy or swollen  skin (e.g., insect bite, skin irritation).   - MILD: Joint looks or feels mildly swollen or puffy.   - MODERATE: Swollen; interferes with normal activities (e.g., work or school); can't move joint normally (bend and straighten completely); may be limping.   - SEVERE: Very swollen; can't move swollen joint at all; limping a lot or unable to walk.     Moderate 4. PAIN: "Is there any pain?" If Yes, ask: "How bad is it?" (Scale 1-10; or mild, moderate, severe)   - NONE (0): no pain.   - MILD (1-3): doesn't interfere with normal activities.    - MODERATE (4-7): interferes with normal activities (e.g., work or school) or awakens from sleep, limping.    - SEVERE (8-10): excruciating pain, unable to do any normal activities, unable to walk.      6/10 7. ASSOCIATED SYMPTOMS: "Is there any pain or redness?"     Pain 8. OTHER SYMPTOMS: "Do you have any other symptoms?" (e.g., chest pain, difficulty breathing, fever, calf pain)     Swelling, pain  Protocols used: Knee Swelling-A-AH

## 2023-05-21 ENCOUNTER — Ambulatory Visit (INDEPENDENT_AMBULATORY_CARE_PROVIDER_SITE_OTHER): Payer: 59

## 2023-05-21 ENCOUNTER — Ambulatory Visit (INDEPENDENT_AMBULATORY_CARE_PROVIDER_SITE_OTHER): Payer: 59 | Admitting: Family Medicine

## 2023-05-21 ENCOUNTER — Encounter: Payer: Self-pay | Admitting: Family Medicine

## 2023-05-21 VITALS — BP 118/76 | HR 68 | Temp 98.3°F | Resp 18 | Ht 68.0 in | Wt 284.3 lb

## 2023-05-21 DIAGNOSIS — M25562 Pain in left knee: Secondary | ICD-10-CM

## 2023-05-21 DIAGNOSIS — M1712 Unilateral primary osteoarthritis, left knee: Secondary | ICD-10-CM | POA: Diagnosis not present

## 2023-05-21 DIAGNOSIS — M25462 Effusion, left knee: Secondary | ICD-10-CM | POA: Diagnosis not present

## 2023-05-21 DIAGNOSIS — E119 Type 2 diabetes mellitus without complications: Secondary | ICD-10-CM

## 2023-05-21 DIAGNOSIS — Z7689 Persons encountering health services in other specified circumstances: Secondary | ICD-10-CM

## 2023-05-21 NOTE — Progress Notes (Signed)
 New Patient Office Visit  Subjective    Patient ID: Joy Cruz, female    DOB: 10-22-1975  Age: 48 y.o. MRN: [id number]  CC:  Chief Complaint  Patient presents with   Establish Care    Patient is here to establish care with new PCP, She states that last year in August patient twisted knee wrong way while at work getting off of a flatbed truck , and then in September she twisted it again and knee became swollen , Both times patient iced it and used aleve and ibuprofen ,  this past Thursday she states that she got out of her car and twisted it again and it became swollen and painful again     HPI [name] presents to establish care with this practice. She is new to me.   Knee pain: Injured left knee three times with last being this past Thursday. Got out of the car and turned to walk and it felt it gave in. Symptoms include swelling and pain in the anterior left knee. Ice, elevation, and staying off of it. Has been using Aleve and ibuprofen  for pain which helps the pain. Aleve helps more than ibuprofen . [facility] [employer] cannot help her with her knee due to being in their [place]. Has a knee brace that she can use.   Diabetes: takes metformin 500 mg BID, recent A1C under 6.5 through [facility]. Next appointment in [place]. Gets A1C every 3 months. Gets CPE with pap smear there too.     Chart review:  01/04/23: ED visit for acute left  knee pain. X-ray: no fracture, mild osteoarthritis.    Outpatient Encounter Medications as of 05/21/2023  Medication Sig   acetaminophen  (TYLENOL ) 500 MG tablet Take 1,000 mg by mouth every 6 (six) hours as needed for moderate pain or headache.   metFORMIN (GLUCOPHAGE) 500 MG tablet Take 500 mg by mouth 2 (two) times daily with a meal.   IRON  PO Take 1 tablet by mouth 2 (two) times daily. (Patient not taking: Reported on 05/21/2023)   [DISCONTINUED] azithromycin  (ZITHROMAX ) 250 MG tablet Take 1 tablet (250 mg total) by mouth daily.  Take first 2 tablets together, then 1 every day until finished.   [DISCONTINUED] benzonatate  (TESSALON ) 100 MG capsule Take 1 capsule (100 mg total) by mouth 3 (three) times daily as needed for cough.   [DISCONTINUED] doxycycline  (VIBRAMYCIN ) 100 MG capsule Take 1 capsule (100 mg total) by mouth 2 (two) times daily.   [DISCONTINUED] medroxyPROGESTERone  (PROVERA ) 10 MG tablet Take 2 tablets (20 mg total) by mouth daily.   No facility-administered encounter medications on file as of 05/21/2023.    Past Medical History:  Diagnosis Date   Anemia    Diabetes mellitus without complication (HCC)     Past Surgical History:  Procedure Laterality Date   CESAREAN SECTION     TUBAL LIGATION      History reviewed. No pertinent family history.  Social History   Socioeconomic History   Marital status: Single    Spouse name: Not on file   Number of children: 3   Years of education: Not on file   Highest education level: Not on file  Occupational History   Not on file  Tobacco Use   Smoking status: Some Days    Types: Pipe    Passive exposure: Past   Smokeless tobacco: Never   Tobacco comments:    ceremonial reasons (only in July)  Vaping Use   Vaping status: Never  Used  Substance and Sexual Activity   Alcohol use: Never   Drug use: Never   Sexual activity: Yes    Birth control/protection: None  Other Topics Concern   Not on file  Social History Narrative   Not on file   Social Drivers of Health   Financial Resource Strain: Not on file  Food Insecurity: Not on file  Transportation Needs: Not on file  Physical Activity: Not on file  Stress: Not on file  Social Connections: Not on file  Intimate Partner Violence: Not on file    Review of Systems  Musculoskeletal:  Positive for joint pain. Negative for falls.        Objective    BP 118/76   Pulse 68   Temp 98.3 F (36.8 C) (Oral)   Resp 18   Ht 5' 8 (1.727 m)   Wt 284 lb 4.8 oz (129 kg)   LMP 05/06/2023 (Exact  Date)   SpO2 97%   BMI 43.23 kg/m   Physical Exam Vitals and nursing note reviewed.  Constitutional:      Appearance: Normal appearance. She is obese.  Cardiovascular:     Rate and Rhythm: Normal rate and regular rhythm.     Heart sounds: Normal heart sounds.  Pulmonary:     Effort: Pulmonary effort is normal.     Breath sounds: Normal breath sounds.  Musculoskeletal:     Left lower leg: Swelling, tenderness and bony tenderness present.  Skin:    General: Skin is warm and dry.  Neurological:     General: No focal deficit present.     Mental Status: She is alert. Mental status is at baseline.  Psychiatric:        Mood and Affect: Mood normal.        Behavior: Behavior normal.        Thought Content: Thought content normal.        Judgment: Judgment normal.        Assessment & Plan:   Problem List Items Addressed This Visit     Establishing care with new doctor, encounter for - Primary   Acute pain of left knee   Has injured her left knee 3 times in the past year.  This past Thursday she was getting out of the car turned to walk and it felt like it gave out symptoms include swelling and pain in the anterior left knee.  She has tried ice, elevation, and staying off of it, symptoms have not improved.  Aleve and ibuprofen  have helped the pain, Aleve is more helpful than ibuprofen .  [facility], who cannot help her with her knee referral.  She has used a knee brace that a friend has let her borrow, she does not have this today.  She will continue to use Aleve with food for pain, left knee wrapped with Ace bandage for support, stat x-ray of the left knee ordered.  Urgent referral placed for orthopedics for further evaluation/treatment.  The left anterior knee has swelling and tenderness to palpation.  She will follow-up as needed with this provider.      Relevant Orders   DG Knee Complete 4 Views Left   Ambulatory referral to  Orthopedics   Diabetes mellitus without complication (HCC)   Taking metformin 500 mg twice daily.  Reports her diabetes is managed per [facility], she gets an A1c done every 3 months, it has been under 6.5.  She also receives her complete physical exam and Pap smear with [facility] as well.     Agrees with plan of care discussed.  Questions answered.   Return if symptoms worsen or fail to improve.   Darice JONELLE Brownie, FNP

## 2023-05-21 NOTE — Assessment & Plan Note (Signed)
 Has injured her left knee 3 times in the past year.  This past Thursday she was getting out of the car turned to walk and it felt like it gave out symptoms include swelling and pain in the anterior left knee.  She has tried ice, elevation, and staying off of it, symptoms have not improved.  Aleve and ibuprofen  have helped the pain, Aleve is more helpful than ibuprofen .  Patient [facility] from [facility], who cannot help her with her knee referral.  She has used a knee brace that a friend has let her borrow, she does not have this today.  She will continue to use Aleve with food for pain, left knee wrapped with Ace bandage for support, stat x-ray of the left knee ordered.  Urgent referral placed for orthopedics for further evaluation/treatment.  The left anterior knee has swelling and tenderness to palpation.  She will follow-up as needed with this provider.

## 2023-05-21 NOTE — Assessment & Plan Note (Signed)
 Taking metformin 500 mg twice daily.  Reports her diabetes is managed per [facility], she gets an A1c done every 3 months, it has been under 6.5.  She also receives her complete physical exam and Pap smear with [facility] as well.

## 2023-05-21 NOTE — Patient Instructions (Signed)
 [facility]  [address]  The radiology department is on the first floor which is best accessed by going around to the back of the building. No appointment necessary. You can go at your convenience.

## 2023-05-22 ENCOUNTER — Other Ambulatory Visit (HOSPITAL_BASED_OUTPATIENT_CLINIC_OR_DEPARTMENT_OTHER): Payer: Self-pay

## 2023-05-22 ENCOUNTER — Encounter (HOSPITAL_BASED_OUTPATIENT_CLINIC_OR_DEPARTMENT_OTHER): Payer: Self-pay | Admitting: Student

## 2023-05-22 ENCOUNTER — Ambulatory Visit (INDEPENDENT_AMBULATORY_CARE_PROVIDER_SITE_OTHER): Payer: 59 | Admitting: Student

## 2023-05-22 DIAGNOSIS — M25562 Pain in left knee: Secondary | ICD-10-CM | POA: Diagnosis not present

## 2023-05-22 MED ORDER — MELOXICAM 15 MG PO TABS
15.0000 mg | ORAL_TABLET | Freq: Every day | ORAL | 0 refills | Status: AC
  Filled 2023-05-22: qty 10, 10d supply, fill #0

## 2023-05-22 NOTE — Progress Notes (Signed)
 Chief Complaint: Left knee pain     History of Present Illness:    Joy Cruz is a 48 y.o. female presenting today for evaluation of left knee pain.  This dates back to last August when she twisted her left knee while stepping off a flatbed truck.  She has since had at least 2 more instances of a twisting injury followed by pain, most recently 6 days ago.  She does report that her knee feels like it is going to give out.  She does have occasional popping and catching.  Today it feels moderately painful, warm, and swollen with pain over the anterior knee.  She has tried rest, ice, heat, and elevating.  Pain worsens when going up stairs.   Surgical History:   None  PMH/PSH/Family History/Social History/Meds/Allergies:    Past Medical History:  Diagnosis Date   Anemia    Diabetes mellitus without complication (HCC)    Past Surgical History:  Procedure Laterality Date   CESAREAN SECTION     TUBAL LIGATION     Social History   Socioeconomic History   Marital status: Single    Spouse name: Not on file   Number of children: 3   Years of education: Not on file   Highest education level: Not on file  Occupational History   Not on file  Tobacco Use   Smoking status: Some Days    Types: Pipe    Passive exposure: Past   Smokeless tobacco: Never   Tobacco comments:    ceremonial reasons (only in July)  Vaping Use   Vaping status: Never Used  Substance and Sexual Activity   Alcohol use: Never   Drug use: Never   Sexual activity: Yes    Birth control/protection: None  Other Topics Concern   Not on file  Social History Narrative   Not on file   Social Drivers of Health   Financial Resource Strain: Not on file  Food Insecurity: Not on file  Transportation Needs: Not on file  Physical Activity: Not on file  Stress: Not on file  Social Connections: Not on file   History reviewed. No pertinent family history. No Known  Allergies Current Outpatient Medications  Medication Sig Dispense Refill   meloxicam  (MOBIC ) 15 MG tablet Take 1 tablet (15 mg total) by mouth daily for 10 days. 10 tablet 0   acetaminophen  (TYLENOL ) 500 MG tablet Take 1,000 mg by mouth every 6 (six) hours as needed for moderate pain or headache.     IRON  PO Take 1 tablet by mouth 2 (two) times daily. (Patient not taking: Reported on 05/21/2023)     metFORMIN (GLUCOPHAGE) 500 MG tablet Take 500 mg by mouth 2 (two) times daily with a meal.     No current facility-administered medications for this visit.   DG Knee Complete 4 Views Left Result Date: 05/21/2023 CLINICAL DATA:  Left knee injury. EXAM: LEFT KNEE - COMPLETE 4+ VIEW COMPARISON:  Radiograph 01/04/2023 FINDINGS: No fracture or dislocation. Mild tricompartmental peripheral spurring, similar to prior exam. Small knee joint effusion. No erosion or focal bone abnormality. Mild soft tissue edema. IMPRESSION: 1. Small knee joint effusion. No fracture or dislocation. 2. Mild tricompartmental osteoarthritis. Electronically Signed   By: Andrea Gasman M.D.   On: 05/21/2023 10:40    Review of  Systems:   A ROS was performed including pertinent positives and negatives as documented in the HPI.  Physical Exam :   Constitutional: NAD and appears stated age Neurological: Alert and oriented Psych: Appropriate affect and cooperative Last menstrual period 05/06/2023.   Comprehensive Musculoskeletal Exam:    Left knee exam demonstrates active range of motion from 0 to 100 degrees.  Mild effusion present.  Positive medial joint line tenderness.  5/5 strength with flexion and extension.  No instability with varus or valgus stress.  Positive McMurray.  Imaging:   Xray review from 05/21/2023 (left knee 4 views): Mild effusion present but otherwise negative for bony abnormality   I personally reviewed and interpreted the radiographs.   Assessment:   48 y.o. female with acute on chronic left knee  pain.  She does have a history of multiple twisting injuries and is experiencing some mechanical symptoms.  X-ray review from primary care yesterday is negative for any abnormality however at this point I do believe an MRI is indicated for further assessment, particularly to evaluate for a meniscus injury.  Did discuss potential for cortisone injection today however patient would like to hold off on this until MRI is completed.  I will start her on a short course of meloxicam  today to help with pain and inflammation.  Soft hinged brace given today for added stability.  Plan :    - Obtain MRI of the left knee and return for review and treatment discussion     I personally saw and evaluated the patient, and participated in the management and treatment plan.  Leonce Reveal, PA-C Orthopedics

## 2023-05-23 ENCOUNTER — Telehealth: Payer: Self-pay | Admitting: Student

## 2023-05-23 NOTE — Telephone Encounter (Signed)
 Patient called asked if [facility] would call her as soon as possible. Patient said she has been calling since 8:00 am this morning and could not get through. Patient said someone transferred her over to [facility] and the phone rang. I advised  patient that  Dr. Genelle is in surgery.  The number to contact patient is [phone]

## 2023-05-23 NOTE — Addendum Note (Signed)
 Addended by: Detra Flower on: 05/23/2023 04:41 PM   Modules accepted: Orders

## 2023-05-26 ENCOUNTER — Ambulatory Visit (HOSPITAL_COMMUNITY)
Admission: RE | Admit: 2023-05-26 | Discharge: 2023-05-26 | Disposition: A | Discharge status: Home / Self Care | Payer: 59 | Source: Ambulatory Visit | Attending: Student | Admitting: Student

## 2023-05-26 DIAGNOSIS — M25562 Pain in left knee: Secondary | ICD-10-CM | POA: Diagnosis present

## 2023-05-27 ENCOUNTER — Encounter (HOSPITAL_BASED_OUTPATIENT_CLINIC_OR_DEPARTMENT_OTHER): Payer: Self-pay

## 2023-05-27 NOTE — Telephone Encounter (Signed)
 Called  patient to [facility] and VM was full

## 2023-05-29 ENCOUNTER — Ambulatory Visit (HOSPITAL_BASED_OUTPATIENT_CLINIC_OR_DEPARTMENT_OTHER): Payer: 59 | Admitting: Student

## 2023-05-29 ENCOUNTER — Other Ambulatory Visit (HOSPITAL_BASED_OUTPATIENT_CLINIC_OR_DEPARTMENT_OTHER): Payer: Self-pay

## 2023-05-29 DIAGNOSIS — S83242A Other tear of medial meniscus, current injury, left knee, initial encounter: Secondary | ICD-10-CM | POA: Diagnosis not present

## 2023-05-29 MED ORDER — IBUPROFEN 800 MG PO TABS
800.0000 mg | ORAL_TABLET | Freq: Three times a day (TID) | ORAL | 0 refills | Status: AC | PRN
  Filled 2023-05-29: qty 30, 10d supply, fill #0

## 2023-05-29 MED ORDER — ACETAMINOPHEN 500 MG PO TABS
500.0000 mg | ORAL_TABLET | Freq: Four times a day (QID) | ORAL | 0 refills | Status: AC | PRN
  Filled 2023-05-29: qty 30, 8d supply, fill #0

## 2023-05-29 MED ORDER — OXYCODONE HCL 5 MG PO TABS
5.0000 mg | ORAL_TABLET | ORAL | 0 refills | Status: DC | PRN
  Filled 2023-05-29: qty 20, 4d supply, fill #0

## 2023-05-29 MED ORDER — ASPIRIN 325 MG PO TBEC
325.0000 mg | DELAYED_RELEASE_TABLET | Freq: Every day | ORAL | 0 refills | Status: AC
  Filled 2023-05-29: qty 14, 14d supply, fill #0

## 2023-05-29 NOTE — Progress Notes (Signed)
Chief Complaint: Left knee pain     History of Present Illness:   05/29/23: Patient is here today for MRI follow-up of her left knee.  Overall she reports little improvement in her symptoms.  She has been taking Tylenol for pain as she stopped taking meloxicam due to increased itching.  Does report some continued buckling in the knee as well as swelling.  She works as a Naval architect.   Joy Cruz is a 48 y.o. female presenting today for evaluation of left knee pain.  This dates back to last August when she twisted her left knee while stepping off a flatbed truck.  She has since had at least 2 more instances of a twisting injury followed by pain, most recently 6 days ago.  She does report that her knee feels like it is going to give out.  She does have occasional popping and catching.  Today it feels moderately painful, warm, and swollen with pain over the anterior knee.  She has tried rest, ice, heat, and elevating.  Pain worsens when going up stairs.   Surgical History:   None  PMH/PSH/Family History/Social History/Meds/Allergies:    Past Medical History:  Diagnosis Date   Anemia    Diabetes mellitus without complication (HCC)    Past Surgical History:  Procedure Laterality Date   CESAREAN SECTION     TUBAL LIGATION     Social History   Socioeconomic History   Marital status: Single    Spouse name: Not on file   Number of children: 3   Years of education: Not on file   Highest education level: Not on file  Occupational History   Not on file  Tobacco Use   Smoking status: Some Days    Types: Pipe    Passive exposure: Past   Smokeless tobacco: Never   Tobacco comments:    ceremonial reasons (only in July)  Vaping Use   Vaping status: Never Used  Substance and Sexual Activity   Alcohol use: Never   Drug use: Never   Sexual activity: Yes    Birth control/protection: None  Other Topics Concern   Not on file  Social History  Narrative   Not on file   Social Drivers of Health   Financial Resource Strain: Not on file  Food Insecurity: Not on file  Transportation Needs: Not on file  Physical Activity: Not on file  Stress: Not on file  Social Connections: Not on file   No family history on file. No Known Allergies Current Outpatient Medications  Medication Sig Dispense Refill   acetaminophen (TYLENOL) 500 MG tablet Take 1 tablet (500 mg total) by mouth every 6 (six) hours as needed for up to 14 days. 30 tablet 0   aspirin EC 325 MG tablet Take 1 tablet (325 mg total) by mouth daily for 14 days. 14 tablet 0   ibuprofen (ADVIL) 800 MG tablet Take 1 tablet (800 mg total) by mouth every 8 (eight) hours as needed for up to 14 days. 30 tablet 0   oxyCODONE (ROXICODONE) 5 MG immediate release tablet Take 1 tablet (5 mg total) by mouth every 4 (four) hours as needed for severe pain (pain score 7-10) or breakthrough pain. 20 tablet 0   acetaminophen (TYLENOL) 500 MG tablet Take 1,000 mg by mouth every  6 (six) hours as needed for moderate pain or headache.     IRON PO Take 1 tablet by mouth 2 (two) times daily. (Patient not taking: Reported on 05/21/2023)     meloxicam (MOBIC) 15 MG tablet Take 1 tablet (15 mg total) by mouth daily for 10 days. 10 tablet 0   metFORMIN (GLUCOPHAGE) 500 MG tablet Take 500 mg by mouth 2 (two) times daily with a meal.     No current facility-administered medications for this visit.   No results found.   Review of Systems:   A ROS was performed including pertinent positives and negatives as documented in the HPI.  Physical Exam :   Constitutional: NAD and appears stated age Neurological: Alert and oriented Psych: Appropriate affect and cooperative Last menstrual period 05/06/2023.   Comprehensive Musculoskeletal Exam:    Left knee exam demonstrates active range of motion from 0 to 100 degrees.  Mild effusion present.  Positive medial joint line tenderness.  5/5 strength with flexion  and extension.  No instability with varus or valgus stress.  Positive McMurray.  Imaging:   MRI left knee: Root tear involving the medial meniscus without evidence of ligamentous injury.  Mild effusion.   I personally reviewed and interpreted the radiographs.   Assessment:   48 y.o. female with a medial meniscus root tear of the left knee.  This may have occurred from initial twisting injury 6 months ago however she has had multiple repeat twisting occurrences since then.  I did discuss this pathology in depth as well as increased risk of progression to early osteoarthritis.  In discussion with myself and Dr. Steward Drone we have recommended arthroscopic meniscus repair.  Risks and benefits of surgical versus nonsurgical management were discussed.  After consideration patient would like to proceed with surgical intervention.  I have provided postop meds for DVT prophylaxis and pain today as well as a hinged knee brace.  Will plan to have her nonweightbearing 2 weeks postop and begin working with physical therapy.  Plan :    - Plan for left knee meniscus repair and centralization with Dr. Steward Drone - Post op meds sent to pharmacy and hinge brace given     I personally saw and evaluated the patient, and participated in the management and treatment plan.  Hazle Nordmann, PA-C Orthopedics

## 2023-06-04 NOTE — Telephone Encounter (Signed)
 I spoke with the patient and scheduled her for surgery on 07/02/23.

## 2023-06-06 ENCOUNTER — Other Ambulatory Visit (HOSPITAL_BASED_OUTPATIENT_CLINIC_OR_DEPARTMENT_OTHER): Payer: Self-pay | Admitting: Orthopaedic Surgery

## 2023-06-06 DIAGNOSIS — S83242A Other tear of medial meniscus, current injury, left knee, initial encounter: Secondary | ICD-10-CM

## 2023-06-25 ENCOUNTER — Encounter (HOSPITAL_BASED_OUTPATIENT_CLINIC_OR_DEPARTMENT_OTHER)
Admission: RE | Admit: 2023-06-25 | Discharge: 2023-06-25 | Disposition: A | Discharge status: Home / Self Care | Source: Ambulatory Visit | Attending: Orthopaedic Surgery | Admitting: Orthopaedic Surgery

## 2023-06-25 ENCOUNTER — Other Ambulatory Visit: Payer: Self-pay

## 2023-06-25 ENCOUNTER — Encounter (HOSPITAL_BASED_OUTPATIENT_CLINIC_OR_DEPARTMENT_OTHER): Payer: Self-pay | Admitting: Orthopaedic Surgery

## 2023-06-25 DIAGNOSIS — E119 Type 2 diabetes mellitus without complications: Secondary | ICD-10-CM | POA: Insufficient documentation

## 2023-06-25 DIAGNOSIS — Z01812 Encounter for preprocedural laboratory examination: Secondary | ICD-10-CM | POA: Diagnosis present

## 2023-06-25 DIAGNOSIS — Z01810 Encounter for preprocedural cardiovascular examination: Secondary | ICD-10-CM | POA: Diagnosis present

## 2023-06-25 DIAGNOSIS — Z01818 Encounter for other preprocedural examination: Secondary | ICD-10-CM | POA: Insufficient documentation

## 2023-06-25 LAB — BASIC METABOLIC PANEL
Anion gap: 9 (ref 5–15)
BUN: 16 mg/dL (ref 6–20)
CO2: 24 mmol/L (ref 22–32)
Calcium: 8.8 mg/dL — ABNORMAL LOW (ref 8.9–10.3)
Chloride: 106 mmol/L (ref 98–111)
Creatinine, Ser: 0.77 mg/dL (ref 0.44–1.00)
GFR, Estimated: >60 mL/min (ref >60)
Glucose, Bld: 146 mg/dL — ABNORMAL HIGH (ref 70–99)
Potassium: 5.1 mmol/L (ref 3.5–5.1)
Sodium: 139 mmol/L (ref 135–145)

## 2023-06-26 ENCOUNTER — Encounter (HOSPITAL_BASED_OUTPATIENT_CLINIC_OR_DEPARTMENT_OTHER): Payer: Self-pay | Admitting: Orthopaedic Surgery

## 2023-06-26 ENCOUNTER — Telehealth (HOSPITAL_BASED_OUTPATIENT_CLINIC_OR_DEPARTMENT_OTHER): Payer: Self-pay | Admitting: Orthopaedic Surgery

## 2023-06-26 NOTE — Telephone Encounter (Signed)
 Patient needs a note saying when her surgery will be and how long she will be out of work. She will come up here to pick it up when it is ready. Please contact the patient [phone] when it is ready to be picked up.

## 2023-06-26 NOTE — Telephone Encounter (Signed)
 Note ready for pickup and patient notified

## 2023-07-01 ENCOUNTER — Ambulatory Visit (HOSPITAL_BASED_OUTPATIENT_CLINIC_OR_DEPARTMENT_OTHER): Payer: Self-pay | Admitting: Orthopaedic Surgery

## 2023-07-01 DIAGNOSIS — S83242A Other tear of medial meniscus, current injury, left knee, initial encounter: Secondary | ICD-10-CM

## 2023-07-02 ENCOUNTER — Encounter (HOSPITAL_BASED_OUTPATIENT_CLINIC_OR_DEPARTMENT_OTHER): Payer: Self-pay | Admitting: Orthopaedic Surgery

## 2023-07-02 ENCOUNTER — Ambulatory Visit (HOSPITAL_BASED_OUTPATIENT_CLINIC_OR_DEPARTMENT_OTHER): Payer: Self-pay | Admitting: Certified Registered Nurse Anesthetist

## 2023-07-02 ENCOUNTER — Encounter (HOSPITAL_BASED_OUTPATIENT_CLINIC_OR_DEPARTMENT_OTHER)
Admission: RE | Disposition: A | Discharge status: Home / Self Care | Payer: Self-pay | Source: Home / Self Care | Attending: Orthopaedic Surgery

## 2023-07-02 ENCOUNTER — Ambulatory Visit (HOSPITAL_BASED_OUTPATIENT_CLINIC_OR_DEPARTMENT_OTHER)
Admission: RE | Admit: 2023-07-02 | Discharge: 2023-07-02 | Disposition: A | Discharge status: Home / Self Care | Payer: 59 | Attending: Orthopaedic Surgery | Admitting: Orthopaedic Surgery

## 2023-07-02 DIAGNOSIS — F17290 Nicotine dependence, other tobacco product, uncomplicated: Secondary | ICD-10-CM | POA: Insufficient documentation

## 2023-07-02 DIAGNOSIS — E119 Type 2 diabetes mellitus without complications: Secondary | ICD-10-CM

## 2023-07-02 DIAGNOSIS — Z01818 Encounter for other preprocedural examination: Secondary | ICD-10-CM

## 2023-07-02 DIAGNOSIS — S83242A Other tear of medial meniscus, current injury, left knee, initial encounter: Secondary | ICD-10-CM

## 2023-07-02 DIAGNOSIS — X58XXXA Exposure to other specified factors, initial encounter: Secondary | ICD-10-CM | POA: Diagnosis not present

## 2023-07-02 DIAGNOSIS — Z7984 Long term (current) use of oral hypoglycemic drugs: Secondary | ICD-10-CM

## 2023-07-02 HISTORY — PX: KNEE ARTHROSCOPY WITH MENISCAL REPAIR: SHX5653

## 2023-07-02 HISTORY — PX: CHONDROPLASTY: SHX5177

## 2023-07-02 LAB — POCT PREGNANCY, URINE: Preg Test, Ur: NEGATIVE

## 2023-07-02 LAB — GLUCOSE, CAPILLARY
Glucose-Capillary: 154 mg/dL — ABNORMAL HIGH (ref 70–99)
Glucose-Capillary: 150 mg/dL — ABNORMAL HIGH (ref 70–99)

## 2023-07-02 SURGERY — ARTHROSCOPY, KNEE, WITH MENISCUS REPAIR
Anesthesia: General | Site: Knee | Laterality: Left

## 2023-07-02 MED ORDER — ACETAMINOPHEN 500 MG PO TABS
1000.0000 mg | ORAL_TABLET | Freq: Once | ORAL | Status: AC
  Administered 2023-07-02: 1000 mg via ORAL

## 2023-07-02 MED ORDER — LACTATED RINGERS IV SOLN: INTRAVENOUS | Status: DC

## 2023-07-02 MED ORDER — LIDOCAINE 2% (20 MG/ML) 5 ML SYRINGE
INTRAMUSCULAR | Status: AC
  Filled 2023-07-02: qty 5

## 2023-07-02 MED ORDER — SODIUM CHLORIDE 0.9 % IR SOLN
Status: DC | PRN
  Administered 2023-07-02: 10000 mL

## 2023-07-02 MED ORDER — KETOROLAC TROMETHAMINE 30 MG/ML IJ SOLN
INTRAMUSCULAR | Status: DC | PRN
Start: 2023-07-02 — End: 2023-07-02
  Administered 2023-07-02: 30 mg via INTRAVENOUS

## 2023-07-02 MED ORDER — FENTANYL CITRATE (PF) 100 MCG/2ML IJ SOLN
INTRAMUSCULAR | Status: DC | PRN
  Administered 2023-07-02 (×4): 50 ug via INTRAVENOUS

## 2023-07-02 MED ORDER — OXYCODONE HCL 5 MG PO TABS
ORAL_TABLET | ORAL | Status: AC
Start: 2023-07-02 — End: ?
  Filled 2023-07-02: qty 1

## 2023-07-02 MED ORDER — ONDANSETRON HCL 4 MG/2ML IJ SOLN
INTRAMUSCULAR | Status: DC | PRN
  Administered 2023-07-02: 4 mg via INTRAVENOUS

## 2023-07-02 MED ORDER — BUPIVACAINE HCL (PF) 0.25 % IJ SOLN
INTRAMUSCULAR | Status: DC | PRN
Start: 2023-07-02 — End: 2023-07-02
  Administered 2023-07-02: 20 mL via INTRA_ARTICULAR

## 2023-07-02 MED ORDER — DEXAMETHASONE SODIUM PHOSPHATE 10 MG/ML IJ SOLN
INTRAMUSCULAR | Status: AC
  Filled 2023-07-02: qty 1

## 2023-07-02 MED ORDER — PROPOFOL 500 MG/50ML IV EMUL
INTRAVENOUS | Status: DC | PRN
  Administered 2023-07-02: 75 ug/kg/min via INTRAVENOUS

## 2023-07-02 MED ORDER — PROPOFOL 10 MG/ML IV BOLUS
INTRAVENOUS | Status: DC | PRN
  Administered 2023-07-02: 200 mg via INTRAVENOUS

## 2023-07-02 MED ORDER — OXYCODONE HCL 5 MG PO TABS
5.0000 mg | ORAL_TABLET | Freq: Once | ORAL | Status: AC
  Administered 2023-07-02: 5 mg via ORAL

## 2023-07-02 MED ORDER — MIDAZOLAM HCL 2 MG/2ML IJ SOLN
INTRAMUSCULAR | Status: AC
  Filled 2023-07-02: qty 2

## 2023-07-02 MED ORDER — MIDAZOLAM HCL 5 MG/5ML IJ SOLN
INTRAMUSCULAR | Status: DC | PRN
  Administered 2023-07-02: 2 mg via INTRAVENOUS

## 2023-07-02 MED ORDER — TRANEXAMIC ACID-NACL 1000-0.7 MG/100ML-% IV SOLN
1000.0000 mg | INTRAVENOUS | Status: AC
Start: 2023-07-02 — End: 2023-07-02
  Administered 2023-07-02: 1000 mg via INTRAVENOUS

## 2023-07-02 MED ORDER — ONDANSETRON HCL 4 MG/2ML IJ SOLN: 4.0000 mg | Freq: Once | INTRAMUSCULAR | Status: DC | PRN

## 2023-07-02 MED ORDER — AMISULPRIDE (ANTIEMETIC) 5 MG/2ML IV SOLN: 10.0000 mg | Freq: Once | INTRAVENOUS | Status: DC | PRN

## 2023-07-02 MED ORDER — FENTANYL CITRATE (PF) 100 MCG/2ML IJ SOLN
INTRAMUSCULAR | Status: AC
  Filled 2023-07-02: qty 2

## 2023-07-02 MED ORDER — GABAPENTIN 300 MG PO CAPS
ORAL_CAPSULE | ORAL | Status: AC
  Filled 2023-07-02: qty 1

## 2023-07-02 MED ORDER — GABAPENTIN 300 MG PO CAPS
300.0000 mg | ORAL_CAPSULE | Freq: Once | ORAL | Status: AC
Start: 2023-07-02 — End: 2023-07-02
  Administered 2023-07-02: 300 mg via ORAL

## 2023-07-02 MED ORDER — PROPOFOL 10 MG/ML IV BOLUS
INTRAVENOUS | Status: AC
  Filled 2023-07-02: qty 20

## 2023-07-02 MED ORDER — KETOROLAC TROMETHAMINE 30 MG/ML IJ SOLN
INTRAMUSCULAR | Status: AC
  Filled 2023-07-02: qty 1

## 2023-07-02 MED ORDER — CEFAZOLIN SODIUM-DEXTROSE 3-4 GM/150ML-% IV SOLN
INTRAVENOUS | Status: AC
  Filled 2023-07-02: qty 150

## 2023-07-02 MED ORDER — FENTANYL CITRATE (PF) 100 MCG/2ML IJ SOLN
25.0000 ug | INTRAMUSCULAR | Status: DC | PRN
  Administered 2023-07-02 (×2): 50 ug via INTRAVENOUS

## 2023-07-02 MED ORDER — TRANEXAMIC ACID-NACL 1000-0.7 MG/100ML-% IV SOLN
INTRAVENOUS | Status: AC
  Filled 2023-07-02: qty 100

## 2023-07-02 MED ORDER — LIDOCAINE HCL (CARDIAC) PF 100 MG/5ML IV SOSY
PREFILLED_SYRINGE | INTRAVENOUS | Status: DC | PRN
  Administered 2023-07-02: 100 mg via INTRAVENOUS

## 2023-07-02 MED ORDER — ONDANSETRON HCL 4 MG/2ML IJ SOLN
INTRAMUSCULAR | Status: AC
  Filled 2023-07-02: qty 2

## 2023-07-02 MED ORDER — ACETAMINOPHEN 500 MG PO TABS
ORAL_TABLET | ORAL | Status: AC
  Filled 2023-07-02: qty 2

## 2023-07-02 MED ORDER — DEXMEDETOMIDINE HCL IN NACL 80 MCG/20ML IV SOLN
INTRAVENOUS | Status: DC | PRN
  Administered 2023-07-02 (×2): 8 ug via INTRAVENOUS

## 2023-07-02 MED ORDER — CEFAZOLIN SODIUM-DEXTROSE 3-4 GM/150ML-% IV SOLN
3.0000 g | INTRAVENOUS | Status: AC
  Administered 2023-07-02: 3 g via INTRAVENOUS

## 2023-07-02 MED ORDER — DEXAMETHASONE SODIUM PHOSPHATE 4 MG/ML IJ SOLN
INTRAMUSCULAR | Status: DC | PRN
  Administered 2023-07-02: 8 mg via INTRAVENOUS

## 2023-07-02 MED ORDER — BUPIVACAINE HCL (PF) 0.25 % IJ SOLN
INTRAMUSCULAR | Status: AC
  Filled 2023-07-02: qty 30

## 2023-07-02 SURGICAL SUPPLY — 58 items
ANCHOR SUT 1.8 FIBERTAK SB KL (Anchor) IMPLANT
BANDAGE ESMARK 6X9 LF (GAUZE/BANDAGES/DRESSINGS) IMPLANT
BLADE EXCALIBUR 4.0X13 (MISCELLANEOUS) IMPLANT
BLADE SURG 15 STRL LF DISP TIS (BLADE) IMPLANT
BNDG ELASTIC 4INX 5YD STR LF (GAUZE/BANDAGES/DRESSINGS) ×1 IMPLANT
BNDG ELASTIC 6INX 5YD STR LF (GAUZE/BANDAGES/DRESSINGS) ×1 IMPLANT
BNDG ESMARK 6X9 LF (GAUZE/BANDAGES/DRESSINGS) IMPLANT
CHLORAPREP W/TINT 26 (MISCELLANEOUS) ×1 IMPLANT
COOLER ICEMAN CLASSIC (MISCELLANEOUS) ×1 IMPLANT
CUFF TRNQT CYL 34X4.125X (TOURNIQUET CUFF) IMPLANT
DISSECTOR 3.8MM X 13CM (MISCELLANEOUS) ×1 IMPLANT
DRAPE ARTHROSCOPY W/POUCH 90 (DRAPES) ×1 IMPLANT
DRAPE IMP U-DRAPE 54X76 (DRAPES) ×1 IMPLANT
DRAPE INCISE IOBAN 66X45 STRL (DRAPES) IMPLANT
DRAPE U-SHAPE 47X51 STRL (DRAPES) ×1 IMPLANT
DW OUTFLOW CASSETTE/TUBE SET (MISCELLANEOUS) ×1 IMPLANT
ELECT REM PT RETURN 9FT ADLT (ELECTROSURGICAL) ×1 IMPLANT
ELECTRODE REM PT RTRN 9FT ADLT (ELECTROSURGICAL) IMPLANT
EXCALIBUR 3.8MM X 13CM (MISCELLANEOUS) IMPLANT
GAUZE 4X4 16PLY ~~LOC~~+RFID DBL (SPONGE) IMPLANT
GAUZE PAD ABD 8X10 STRL (GAUZE/BANDAGES/DRESSINGS) ×1 IMPLANT
GAUZE SPONGE 4X4 12PLY STRL (GAUZE/BANDAGES/DRESSINGS) ×1 IMPLANT
GAUZE XEROFORM 1X8 LF (GAUZE/BANDAGES/DRESSINGS) ×1 IMPLANT
GLOVE BIO SURGEON STRL SZ 6 (GLOVE) ×1 IMPLANT
GLOVE BIO SURGEON STRL SZ7.5 (GLOVE) ×1 IMPLANT
GLOVE BIOGEL PI IND STRL 6.5 (GLOVE) ×1 IMPLANT
GLOVE BIOGEL PI IND STRL 8 (GLOVE) ×1 IMPLANT
GOWN STRL REUS W/ TWL LRG LVL3 (GOWN DISPOSABLE) ×2 IMPLANT
GOWN STRL REUS W/ TWL XL LVL3 (GOWN DISPOSABLE) ×1 IMPLANT
KIT ROOT REPAIR MEINISCAL PEEK (Anchor) IMPLANT
KIT STR SPEAR 1.8 FBRTK DISP (KITS) IMPLANT
KIT SUTLOC MENISCAL ROOT REP (Anchor) IMPLANT
LASSO 90 CVE QUICKPAS (DISPOSABLE) IMPLANT
LOOP 2 FIBERLINK CLOSED (SUTURE) IMPLANT
MANIFOLD NEPTUNE II (INSTRUMENTS) ×1 IMPLANT
MEINISCAL ROOT REPAIR KIT PEEK (Anchor) ×1 IMPLANT
NDL HYPO 18GX1.5 BLUNT FILL (NEEDLE) ×1 IMPLANT
NDL SAFETY ECLIPSE 18X1.5 (NEEDLE) ×1 IMPLANT
NDL SUT 2-0 SCORPION KNEE (NEEDLE) IMPLANT
NEEDLE HYPO 18GX1.5 BLUNT FILL (NEEDLE) IMPLANT
NEEDLE SUT 2-0 SCORPION KNEE (NEEDLE) IMPLANT
PACK ARTHROSCOPY DSU (CUSTOM PROCEDURE TRAY) ×1 IMPLANT
PACK BASIN DAY SURGERY FS (CUSTOM PROCEDURE TRAY) ×1 IMPLANT
PAD COLD SHLDR WRAP-ON (PAD) ×1 IMPLANT
PADDING CAST COTTON 6X4 STRL (CAST SUPPLIES) ×1 IMPLANT
PENCIL SMOKE EVACUATOR (MISCELLANEOUS) IMPLANT
PICK POWER XL 45DEG (MISCELLANEOUS) IMPLANT
SLEEVE SCD COMPRESS KNEE MED (STOCKING) ×1 IMPLANT
SUCTION TUBE FRAZIER 10FR DISP (SUCTIONS) ×1 IMPLANT
SUT ETHILON 3 0 PS 1 (SUTURE) ×1 IMPLANT
SUT VIC AB 2-0 CT1 TAPERPNT 27 (SUTURE) IMPLANT
SUT VIC AB 2-0 SH 27XBRD (SUTURE) IMPLANT
SUT VIC AB 3-0 FS2 27 (SUTURE) IMPLANT
SYR 5ML LL (SYRINGE) ×1 IMPLANT
TOWEL GREEN STERILE FF (TOWEL DISPOSABLE) ×2 IMPLANT
TRAY DSU PREP LF (CUSTOM PROCEDURE TRAY) ×1 IMPLANT
TUBING ARTHROSCOPY IRRIG 16FT (MISCELLANEOUS) ×1 IMPLANT
WAND ABLATOR APOLLO I90 (BUR) ×1 IMPLANT

## 2023-07-02 NOTE — Brief Op Note (Signed)
   Brief Op Note  Date of Surgery: 07/02/2023  Preoperative Diagnosis: LEFT KNEE MEDIAL MENISCUS TEAR  Postoperative Diagnosis: same  Procedure: Procedure(s): LEFT KNEE ARTHROSCOPY WITH MENISCAL REPAIR FEMORAL PATELLA CHONDROPLASTY  Implants: Implant Name Type Inv. Item Serial No. Manufacturer Lot No. LRB No. Used Action  MEINISCAL ROOT REPAIR KIT PEEK - [id number] Anchor MEINISCAL ROOT REPAIR KIT PEEK  [employer] [id number] Left 1 Implanted  ANCHOR SUT 1.8 FIBERTAK SB KL - [id number] Anchor ANCHOR SUT 1.8 [employer] [id number] Left 1 Implanted    Surgeons: Surgeon(s): Huel Cote, MD  Anesthesia: General    Estimated Blood Loss: See anesthesia record  Complications: None  Condition to PACU: Stable  Benancio Deeds, MD 07/02/2023 12:23 PM

## 2023-07-02 NOTE — Anesthesia Postprocedure Evaluation (Signed)
 Anesthesia Post Note  Patient: Cyana Aydelotte  Procedure(s) Performed: LEFT KNEE ARTHROSCOPY WITH MENISCAL REPAIR (Left: Knee) FEMORAL PATELLA CHONDROPLASTY (Left: Knee)     Patient location during evaluation: PACU Anesthesia Type: General Level of consciousness: awake and alert Pain management: pain level controlled Vital Signs Assessment: post-procedure vital signs reviewed and stable Respiratory status: spontaneous breathing, nonlabored ventilation and respiratory function stable Cardiovascular status: blood pressure returned to baseline and stable Postop Assessment: no apparent nausea or vomiting Anesthetic complications: no   No notable events documented.  Last Vitals:  Vitals:   07/02/23 1300 07/02/23 1330  BP: (!) 144/90 (!) 169/100  Pulse: 78 (!) 57  Resp: 17 10  Temp:    SpO2: 95% 100%    Last Pain:  Vitals:   07/02/23 1320  TempSrc:   PainSc: 8                  Collene Schlichter

## 2023-07-02 NOTE — Discharge Instructions (Addendum)
 Discharge Instructions    Attending Surgeon: Huel Cote, MD Office Phone Number: [phone]   Diagnosis and Procedures:    Surgeries Performed: Left knee medial meniscal repair  Discharge Plan:    Diet: Resume usual diet. Begin with light or bland foods.  Drink plenty of fluids.  Activity:  Keep  dressing in place until your follow up visit in Physical Therapy You are advised to go home directly from the hospital or surgical center. Restrict your activities. Non weight bearing for 2 weeks  GENERAL INSTRUCTIONS   2. Please call Dr. Serena Croissant office at [phone] with questions Monday-Friday during business hours. If no one answers, please leave a message and someone should get back to the patient within 24 hours. For emergencies please call 911 or proceed to the emergency room.   3. Patient to notify surgical team if experiences any of the following: Bowel/Bladder dysfunction, uncontrolled pain, nerve/muscle weakness, incision with increased drainage or redness, nausea/vomiting and Fever greater than 101.0 F.  Be alert for signs of infection including redness, streaking, odor, fever or chills. Be alert for excessive pain or bleeding and notify your surgeon immediately.  WOUND INSTRUCTIONS:   Leave your dressing/cast/splint in place until your post operative visit.  Keep it clean and dry.  Always keep the incision clean and dry until the staples/sutures are removed. If there is no drainage from the incision you should keep it open to air. If there is drainage from the incision you must keep it covered at all times until the drainage stops  Do not soak in a bath tub, hot tub, pool, lake or other body of water until 21 days after your surgery and your incision is completely dry and healed.  If you have removable sutures (or staples) they must be removed 10-14 days (unless otherwise instructed) from the day of your surgery.     1)  Elevate the extremity as much as  possible.  2)  Keep the dressing clean and dry.  3)  Please call us if the dressing becomes wet or dirty.  4)  If you are experiencing worsening pain or worsening swelling, please call.     MEDICATIONS: Resume all previous home medications at the previous prescribed dose and frequency unless otherwise noted Start taking the  pain medications on an as-needed basis as prescribed  Please taper down pain medication over the next week following surgery.  Ideally you should not require a refill of any narcotic pain medication.  Take pain medication with food to minimize nausea. In addition to the prescribed pain medication, you may take over-the-counter pain relievers such as Tylenol.  Do NOT take additional tylenol if your pain medication already has tylenol in it.  Aspirin 325mg  daily per bottle instructions. Narcotic Policy: Per [facility] clinic policy, our goal is ensure optimal postoperative pain control with a multimodal pain management strategy. For all [facility] patients, our goal is to wean post-operative narcotic medications by 6 weeks post-operatively, and many times sooner. If this is not possible due to utilization of pain medication prior to surgery, your [facility] doctor will support your acute post-operative pain control for the first 6 weeks postoperatively, with a plan to transition you back to your primary pain team following that. Cyndia Skeeters will work to ensure a Therapist, occupational.      FOLLOWUP INSTRUCTIONS: 1. Follow up at the Physical Therapy Clinic 3-4 days following surgery. This appointment should be scheduled unless other arrangements have been made.The Physical Therapy  scheduling number is [phone] if an appointment has not already been arranged.  2. Contact Dr. Serena Croissant office during office hours at [phone] or the practice after hours line at [phone] for non-emergencies. For medical emergencies call 911.   Discharge Location: Home  No Tylenol until 3:30pm  today, if needed.

## 2023-07-02 NOTE — Op Note (Signed)
 Date of Surgery: 07/02/2023  INDICATIONS: Joy Cruz is a 48 y.o.-year-old female with left knee medial meniscal root tear with extrusion.  The risk and benefits of the procedure were discussed in detail and documented in the pre-operative evaluation.   PREOPERATIVE DIAGNOSIS: 1.  Left knee medial meniscal tear, root  POSTOPERATIVE DIAGNOSIS: Same.  PROCEDURE: 1.  Left knee medial meniscal root repair with centralization 2.  Left knee medial femoral condyle abrasion arthroplasty  SURGEON: Benancio Deeds MD  ASSISTANT: Ardeen Fillers, ATC  ANESTHESIA:  general +0.25% Marcaine 20cc  IV FLUIDS AND URINE: See anesthesia record.  ANTIBIOTICS: Ancef  ESTIMATED BLOOD LOSS: 5 mL.  IMPLANTS:  Implant Name Type Inv. Item Serial No. Manufacturer Lot No. LRB No. Used Action  MEINISCAL ROOT REPAIR KIT PEEK - [id number] Anchor MEINISCAL ROOT REPAIR KIT PEEK  [employer] [id number] Left 1 Implanted  ANCHOR SUT 1.8 FIBERTAK SB KL - [id number] Anchor ANCHOR SUT 1.8 FIBERTAK SB KL  [employer] [id number] Left 1 Implanted    DRAINS: None  CULTURES: None  COMPLICATIONS: none  DESCRIPTION OF PROCEDURE:  Examination under anesthesia: A careful examination under anesthesia was performed.  Knee ROM motion was: -3- 135 Lachman: Normal Pivot Shift: Normal Posterior drawer: normal.   Varus stability in full extension: normal.   Varus stability in 30 degrees of flexion: normal.  Valgus stability in full extension: normal.   Valgus stability in 30 degrees of flexion: normal.  Posterolateral drawer: normal   Intra-operative findings: A thorough arthroscopic examination of the knee was performed.  The findings are: 1. Suprapatellar pouch: Normal 2. Undersurface of median ridge: Normal 3. Medial patellar facet: Normal 4. Lateral patellar facet: Normal 5. Trochlea: Grade 2 chondral loss 6. Lateral gutter/popliteus tendon: Normal 7. Hoffa's fat pad: Normal 8. Medial gutter/plica: Normal 9. ACL:  Normal 10. PCL: Normal 11. Medial meniscus: Normal 12. Medial compartment cartilage: Normal 13. Lateral meniscus: Normal 14. Lateral compartment cartilage: Normal  I identified the patient in the pre-operative holding area.  I marked the operative knee with my initials. I reviewed the risks and benefits of the proposed surgical intervention and the patient wished to proceed.  Anesthesia performed a peripheral nerve block.  Patient was subsequently taken back to the operating room.  The patient was transferred to the operative suite and placed in the supine position with all bony prominences padded.     SCDs were placed on the non-operative lower extremity. Appropriate antibiotics was administered within 1 hour before incision. The operative lower extremity was then prepped and draped in standard fashion. A time out was performed confirming the correct extremity, correct patient and correct procedure.    A standard anterolateral portal was made with an 11 blade.  The ideal position for the anteromedial portal was established using a spinal needle.  This AM portal was then created under direct visualization with an 11 blade.  A full diagnostic arthroscopy was then performed, as described above, including probing of the chondral and meniscal surfaces.     The meniscus root repair was then performed.  A knee scorpion from [employer] was then used to place two 0 non-absorbable sutures through the torn posterior horn meniscus 1.5 cm medial to the posterior root. The 2 free ends of the suture were passed through the loop, creating a locking stitch/luggage tag.   A tibial tunnel was created at the media meniscal root attachment using an ACL guide. Once the guide was positioned intra-articularly at the center of the  meniscal root, the [employer] retrocutter was introduced up through the footprint of the root.  The sleeve was malleted into place and a fiber stick was then placed.  The sutures were retrieved through  the medial portal.  These were used to pass the sutures in the root.  Care was taken to ensure that no suture bridge had formed.  The sutures of the meniscal root were then inserted into the tibia using a 4.75 mm swivel lock.  This was done by first drilling into the tibial bone and subsequently tapping the bone.  The sutures were tensioned and the anchor was placed.  Arthroscopy confirmed anatomic reduction of the root of the meniscus with good tension.  At this time a centralization procedure was performed.  An accessory superior medial portal was utilized.  A knotless fiber tack anchor was placed percutaneously using this portal.  Universal lasso was then placed through this portal at the meniscocapsular junction.  Sutures were retrieved via the standard medial portal.  This was passed and tensioned.  This had excellent opposition and medialization of the meniscus.  At this time the power pick was then introduced and drilling was performed of the medial femoral condyle lesion approximately 2 to 3 mm apart at each site.  This had excellent bleeding  That concluded the case.  Skin was closed with 2-0 Vicryl and 3-0 nylon. Xeroform gauze, gauze, Tegaderm, Iceman and brace were applied.  Instrument, sponge, and needle counts were correct prior to wound closure and at the conclusion of the case.  The patient was taken to the PACU without complication     POSTOPERATIVE PLAN: Non weight bearing for 2 weeks. She will be placed on aspirin for blood clot prevention. She will be seen immediately postop for physical therapy.  Benancio Deeds, MD 12:23 PM

## 2023-07-02 NOTE — Anesthesia Preprocedure Evaluation (Addendum)
 Anesthesia Evaluation  Patient identified by MRN, date of birth, ID band Patient awake    Reviewed: Allergy & Precautions, NPO status , Patient's Chart, lab work & pertinent test results  History of Anesthesia Complications Negative for: history of anesthetic complications  Airway Mallampati: II  TM Distance: >3 FB Neck ROM: Full    Dental  (+) Teeth Intact, Dental Advisory Given, Missing,    Pulmonary Current Smoker and Patient abstained from smoking.   Pulmonary exam normal breath sounds clear to auscultation       Cardiovascular Exercise Tolerance: Good negative cardio ROS Normal cardiovascular exam Rhythm:Regular Rate:Normal     Neuro/Psych negative neurological ROS  negative psych ROS   GI/Hepatic negative GI ROS, Neg liver ROS,,,  Endo/Other  diabetes, Type 2, Oral Hypoglycemic Agents  Class 3 obesity  Renal/GU negative Renal ROS     Musculoskeletal LEFT KNEE MEDIAL MENISCUS TEAR   Abdominal   Peds  Hematology negative hematology ROS (+)   Anesthesia Other Findings Day of surgery medications reviewed with the patient.  Reproductive/Obstetrics                             Anesthesia Physical Anesthesia Plan  ASA: 3  Anesthesia Plan: General   Post-op Pain Management: Toradol IV (intra-op)*, Tylenol PO (pre-op)* and Gabapentin PO (pre-op)*   Induction: Intravenous  PONV Risk Score and Plan: 2 and Midazolam, Dexamethasone and Ondansetron  Airway Management Planned: LMA  Additional Equipment:   Intra-op Plan:   Post-operative Plan: Extubation in OR  Informed Consent: I have reviewed the patients History and Physical, chart, labs and discussed the procedure including the risks, benefits and alternatives for the proposed anesthesia with the patient or authorized representative who has indicated his/her understanding and acceptance.     Dental advisory given  Plan  Discussed with: CRNA  Anesthesia Plan Comments:        Anesthesia Quick Evaluation

## 2023-07-02 NOTE — Interval H&P Note (Signed)
 History and Physical Interval Note:  07/02/2023 10:46 AM  Joy Cruz  has presented today for surgery, with the diagnosis of LEFT KNEE MEDIAL MENISCUS TEAR.  The various methods of treatment have been discussed with the patient and family. After consideration of risks, benefits and other options for treatment, the patient has consented to  Procedure(s): LEFT KNEE ARTHROSCOPY WITH MENISCAL REPAIR (Left) as a surgical intervention.  The patient's history has been reviewed, patient examined, no change in status, stable for surgery.  I have reviewed the patient's chart and labs.  Questions were answered to the patient's satisfaction.     Huel Cote

## 2023-07-02 NOTE — Transfer of Care (Signed)
 Immediate Anesthesia Transfer of Care Note  Patient: Joy Cruz  Procedure(s) Performed: LEFT KNEE ARTHROSCOPY WITH MENISCAL REPAIR (Left: Knee) FEMORAL PATELLA CHONDROPLASTY (Left: Knee)  Patient Location: PACU  Anesthesia Type:General  Level of Consciousness: awake, alert , and oriented  Airway & Oxygen Therapy: Patient Spontanous Breathing and Patient connected to face mask oxygen  Post-op Assessment: Report given to RN and Post -op Vital signs reviewed and stable  Post vital signs: Reviewed and stable  Last Vitals:  Vitals Value Taken Time  BP 149/83 07/02/23 1233  Temp    Pulse 73 07/02/23 1235  Resp 18 07/02/23 1235  SpO2 98 % 07/02/23 1235  Vitals shown include unfiled device data.  Last Pain:  Vitals:   07/02/23 0923  TempSrc: Temporal  PainSc: 8       Patients Stated Pain Goal: 6 (07/02/23 5621)  Complications: No notable events documented.

## 2023-07-02 NOTE — H&P (Signed)
 Chief Complaint: Left knee pain        History of Present Illness:    05/29/23: Patient is here today for MRI follow-up of her left knee.  Overall she reports little improvement in her symptoms.  She has been taking Tylenol for pain as she stopped taking meloxicam due to increased itching.  Does report some continued buckling in the knee as well as swelling.  She works as a Naval architect.     Joy Cruz is a 48 y.o. female presenting today for evaluation of left knee pain.  This dates back to last August when she twisted her left knee while stepping off a flatbed truck.  She has since had at least 2 more instances of a twisting injury followed by pain, most recently 6 days ago.  She does report that her knee feels like it is going to give out.  She does have occasional popping and catching.  Today it feels moderately painful, warm, and swollen with pain over the anterior knee.  She has tried rest, ice, heat, and elevating.  Pain worsens when going up stairs.     Surgical History:   None   PMH/PSH/Family History/Social History/Meds/Allergies:         Past Medical History:  Diagnosis Date   Anemia     Diabetes mellitus without complication (HCC)               Past Surgical History:  Procedure Laterality Date   CESAREAN SECTION       TUBAL LIGATION            Social History         Socioeconomic History   Marital status: Single      Spouse name: Not on file   Number of children: 3   Years of education: Not on file   Highest education level: Not on file  Occupational History   Not on file  Tobacco Use   Smoking status: Some Days      Types: Pipe      Passive exposure: Past   Smokeless tobacco: Never   Tobacco comments:      ceremonial reasons (only in July)  Vaping Use   Vaping status: Never Used  Substance and Sexual Activity   Alcohol use: Never   Drug use: Never   Sexual activity: Yes      Birth control/protection: None  Other Topics  Concern   Not on file  Social History Narrative   Not on file    Social Drivers of Health    Financial Resource Strain: Not on file  Food Insecurity: Not on file  Transportation Needs: Not on file  Physical Activity: Not on file  Stress: Not on file  Social Connections: Not on file    No family history on file.     Allergies  No Known Allergies         Current Outpatient Medications  Medication Sig Dispense Refill   acetaminophen (TYLENOL) 500 MG tablet Take 1 tablet (500 mg total) by mouth every 6 (six) hours as needed for up to 14 days. 30 tablet 0   aspirin EC 325 MG tablet Take 1 tablet (325 mg total) by mouth daily for 14 days. 14 tablet 0   ibuprofen (ADVIL) 800 MG tablet Take 1 tablet (800 mg total) by mouth every 8 (eight) hours as needed for up to 14 days. 30 tablet 0   oxyCODONE (ROXICODONE) 5 MG immediate  release tablet Take 1 tablet (5 mg total) by mouth every 4 (four) hours as needed for severe pain (pain score 7-10) or breakthrough pain. 20 tablet 0   acetaminophen (TYLENOL) 500 MG tablet Take 1,000 mg by mouth every 6 (six) hours as needed for moderate pain or headache.       IRON PO Take 1 tablet by mouth 2 (two) times daily. (Patient not taking: Reported on 05/21/2023)       meloxicam (MOBIC) 15 MG tablet Take 1 tablet (15 mg total) by mouth daily for 10 days. 10 tablet 0   metFORMIN (GLUCOPHAGE) 500 MG tablet Take 500 mg by mouth 2 (two) times daily with a meal.          No current facility-administered medications for this visit.      Imaging Results (Last 48 hours)  No results found.       Review of Systems:   A ROS was performed including pertinent positives and negatives as documented in the HPI.   Physical Exam :   Constitutional: NAD and appears stated age Neurological: Alert and oriented Psych: Appropriate affect and cooperative Last menstrual period 05/06/2023.    Comprehensive Musculoskeletal Exam:     Left knee exam demonstrates active  range of motion from 0 to 100 degrees.  Mild effusion present.  Positive medial joint line tenderness.  5/5 strength with flexion and extension.  No instability with varus or valgus stress.  Positive McMurray.   Imaging:   MRI left knee: Root tear involving the medial meniscus without evidence of ligamentous injury.  Mild effusion.     I personally reviewed and interpreted the radiographs.     Assessment:   49 y.o. female with a medial meniscus root tear of the left knee.  This may have occurred from initial twisting injury 6 months ago however she has had multiple repeat twisting occurrences since then.  I did discuss this pathology in depth as well as increased risk of progression to early osteoarthritis.  I have recommended arthroscopic meniscus repair.  Risks and benefits of surgical versus nonsurgical management were discussed.  After consideration patient would like to proceed with surgical intervention.  I have provided postop meds for DVT prophylaxis and pain today as well as a hinged knee brace.  Will plan to have her nonweightbearing 2 weeks postop and begin working with physical therapy.   Plan :     - Plan for left knee meniscus repair and centralization   After a lengthy discussion of treatment options, including risks, benefits, alternatives, complications of surgical and nonsurgical conservative options, the patient elected surgical repair.   The patient  is aware of the material risks  and complications including, but not limited to injury to adjacent structures, neurovascular injury, infection, numbness, bleeding, implant failure, thermal burns, stiffness, persistent pain, failure to heal, disease transmission from allograft, need for further surgery, dislocation, anesthetic risks, blood clots, risks of death,and others. The probabilities of surgical success and failure discussed with patient given their particular co-morbidities.The time and nature of expected rehabilitation and  recovery was discussed.The patient's questions were all answered preoperatively.  No barriers to understanding were noted. I explained the natural history of the disease process and Rx rationale.  I explained to the patient what I considered to be reasonable expectations given their personal situation.  The final treatment plan was arrived at through a shared patient decision making process model.          I personally  saw and evaluated the patient, and participated in the management and treatment plan.

## 2023-07-02 NOTE — Anesthesia Procedure Notes (Signed)
 Procedure Name: LMA Insertion Date/Time: 07/02/2023 11:08 AM  Performed by: Cleda Clarks, CRNAPre-anesthesia Checklist: Patient identified, Emergency Drugs available, Suction available and Patient being monitored Patient Re-evaluated:Patient Re-evaluated prior to induction Oxygen Delivery Method: Circle system utilized Preoxygenation: Pre-oxygenation with 100% oxygen Induction Type: IV induction Ventilation: Mask ventilation without difficulty LMA: LMA inserted LMA Size: 4.0 Number of attempts: 1 Placement Confirmation: positive ETCO2 Tube secured with: Tape Dental Injury: Teeth and Oropharynx as per pre-operative assessment

## 2023-07-03 ENCOUNTER — Encounter (HOSPITAL_BASED_OUTPATIENT_CLINIC_OR_DEPARTMENT_OTHER): Payer: Self-pay | Admitting: Orthopaedic Surgery

## 2023-07-03 ENCOUNTER — Telehealth (HOSPITAL_BASED_OUTPATIENT_CLINIC_OR_DEPARTMENT_OTHER): Payer: Self-pay | Admitting: Orthopaedic Surgery

## 2023-07-03 ENCOUNTER — Other Ambulatory Visit (HOSPITAL_BASED_OUTPATIENT_CLINIC_OR_DEPARTMENT_OTHER): Payer: Self-pay | Admitting: Orthopaedic Surgery

## 2023-07-03 NOTE — Telephone Encounter (Signed)
 Pt was confused because anaesthesia  yesterday said she should be taking gabapentin I called patient to confirm your post op medications were tylenol, ibuprofen, oxycodone and aspirin and you did not prescribe gabapentin post op  No further questions asked

## 2023-07-03 NOTE — Telephone Encounter (Signed)
 Patient states she has questions about her medications please call her [phone]

## 2023-07-04 ENCOUNTER — Encounter (HOSPITAL_BASED_OUTPATIENT_CLINIC_OR_DEPARTMENT_OTHER): Payer: Self-pay | Admitting: Physical Therapy

## 2023-07-04 ENCOUNTER — Ambulatory Visit (HOSPITAL_BASED_OUTPATIENT_CLINIC_OR_DEPARTMENT_OTHER): Attending: Orthopaedic Surgery | Admitting: Physical Therapy

## 2023-07-04 DIAGNOSIS — M25562 Pain in left knee: Secondary | ICD-10-CM | POA: Diagnosis present

## 2023-07-04 DIAGNOSIS — M25662 Stiffness of left knee, not elsewhere classified: Secondary | ICD-10-CM | POA: Insufficient documentation

## 2023-07-04 DIAGNOSIS — S83242A Other tear of medial meniscus, current injury, left knee, initial encounter: Secondary | ICD-10-CM | POA: Insufficient documentation

## 2023-07-04 DIAGNOSIS — R2689 Other abnormalities of gait and mobility: Secondary | ICD-10-CM | POA: Diagnosis present

## 2023-07-04 NOTE — Therapy (Signed)
 OUTPATIENT PHYSICAL THERAPY LOWER EXTREMITY EVALUATION   Patient Name: Joy Cruz MRN: [id number] DOB:[birth year], 48 y.o., female Today's Date: 07/04/2023  END OF SESSION:  PT End of Session - 07/04/23 2035     Visit Number 1    Number of Visits 16    Date for PT Re-Evaluation 08/29/23    PT Start Time 1315    PT Stop Time 1358    PT Time Calculation (min) 43 min    Activity Tolerance Patient tolerated treatment well    Behavior During Therapy [facility] for tasks assessed/performed             Past Medical History:  Diagnosis Date   Anemia    Diabetes mellitus without complication (HCC)    Past Surgical History:  Procedure Laterality Date   CESAREAN SECTION     CHONDROPLASTY Left 07/02/2023   Procedure: FEMORAL PATELLA CHONDROPLASTY;  Surgeon: Huel Cote, MD;  Location: [facility];  Service: Orthopedics;  Laterality: Left;   KNEE ARTHROSCOPY WITH MENISCAL REPAIR Left 07/02/2023   Procedure: LEFT KNEE ARTHROSCOPY WITH MENISCAL REPAIR;  Surgeon: Huel Cote, MD;  Location: [facility];  Service: Orthopedics;  Laterality: Left;   Patient Active Problem List   Diagnosis Date Noted   Tear of medial meniscus of left knee, current 07/02/2023   Establishing care with new doctor, encounter for 05/21/2023   Acute pain of left knee 05/21/2023   Diabetes mellitus without complication (HCC) 05/21/2023    PCP: Dorena Bodo FNP  REFERRING PROVIDER: Dr Cristela Felt MD   REFERRING DIAG: 1.  Left knee medial meniscal root repair with centralization 2.  Left knee medial femoral condyle abrasion arthroplasty  THERAPY DIAG:  Acute pain of left knee  Stiffness of left knee, not elsewhere classified  Other abnormalities of gait and mobility  Rationale for Evaluation and Treatment: Rehabilitation  ONSET DATE: 06/04/2023  SUBJECTIVE:   SUBJECTIVE STATEMENT: Patient is a 48 year old female status post left knee medial meniscal root repair with  left knee medial femoral condyle abrasion arthroplasty on 06/04/2023.  The patient was stepping off a truck in August 2024 when she twisted her knee.  She had continuous pain until her surgical repair.  At this time she is still having high levels of pain.  She reports her pain fluctuates between 7 and 9.  She feels significant stiffness.  PERTINENT HISTORY: DM2, C-section PAIN:  Are you having pain? Yes: NPRS scale: 7/10 has been a consistant 9-10  Pain location: Medial knee Pain description: Aching Aggravating factors: standing and walking  Relieving factors: rest, ice and pain medication   PRECAUTIONS: None  RED FLAGS: None   WEIGHT BEARING RESTRICTIONS: Yes NWB 2 weeks   FALLS:  Has patient fallen in last 6 months? No  LIVING ENVIRONMENT: No steps into her house  OCCUPATION:   Drives a truck   Hobbies: Data processing manager    PLOF: Independent  PATIENT GOALS:  Get back to regular activity  NEXT MD VISIT:  Next week   OBJECTIVE:  Note: Objective measures were completed at Evaluation unless otherwise noted.  DIAGNOSTIC FINDINGS:    PATIENT SURVEYS:  LEFS 5/80  COGNITION: Overall cognitive status: Within functional limits for tasks assessed     SENSATION: Numbness around the surgical site  EDEMA:  Circumferential: Will measure next visit    POSTURE: forward head  PALPATION: No unexpected tenderness to palpation   LOWER EXTREMITY ROM:  Passive ROM Right eval Left  eval  Hip flexion    Hip extension    Hip abduction    Hip adduction    Hip internal rotation    Hip external rotation    Knee flexion  30 with physical therapist able to self assist with strap to 40 degrees  Knee extension  -5  Ankle dorsiflexion    Ankle plantarflexion    Ankle inversion    Ankle eversion     (Blank rows = not tested)  LOWER EXTREMITY MMT:  MMT Right eval Left eval  Hip flexion    Hip extension    Hip abduction    Hip adduction    Hip  internal rotation    Hip external rotation    Knee flexion    Knee extension    Ankle dorsiflexion    Ankle plantarflexion    Ankle inversion    Ankle eversion     (Blank rows = not tested) No formal MMT 2nd to recent surgery   GAIT: Reviewed ambulation with crutches nonweightbearing.  Patient has been weightbearing.  She reports at home with time she is 1 crutch.  Per op note patient is nonweightbearing at this time.  Patient strongly advised to continue with nonweightbearing status                                                                                                                               TREATMENT DATE:  Manual: Gentle passive range of motion into available ranges.  Assessment of range of motion.  Review of self range of motion.  There ex: Reviewed quad set.  Reviewed quad set with opposite leg for muscle firing. Self-care: Redressed patient's dressing.  Refit patient's brace.  Patient's brace was set low. Gait: See gait section   PATIENT EDUCATION:  Education details: HEP, symptom management, importance of following weightbearing precautions. Person educated: Patient Education method: Explanation, Demonstration, Tactile cues, Verbal cues, and Handouts Education comprehension: verbalized understanding, returned demonstration, verbal cues required, tactile cues required, and needs further education  HOME EXERCISE PROGRAM: Access Code: [email or web address] URL: [email or web address] Date: 07/04/2023 Prepared by: Lorayne Bender  Exercises - Supine Quad Set  - 3-4 x daily - 7 x weekly - 3 sets - 10 reps - Supine Heel Slide with Strap  - 3-4 x daily - 7 x weekly - 3 sets - 10 reps  ASSESSMENT:  CLINICAL IMPRESSION: Patient is a 48 year old female status post left medial meniscal root repair and femoral condyle chondroplasty on 07/02/2023.  She presents with expected limitations in range of motion, function, and strength, she is nonweightbearing at this time.   Therapy reviewed gait training with the patient.  Therapy also adjusted the patient's brace to fit better.  She is limited by pain today.  Patient was given a limited HEP to begin working on base range of motion.  She a advised not to move range of motion past 90 degrees.  She is nowhere near 38  degrees at this time.  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 OBJECTIVE IMPAIRMENTS: Abnormal gait, decreased activity tolerance, decreased knowledge of condition, decreased knowledge of use of DME, decreased mobility, difficulty walking, decreased ROM, decreased strength, increased edema, and pain.   ACTIVITY LIMITATIONS: carrying, standing, squatting, sleeping, stairs, transfers, bed mobility, bathing, toileting, dressing, hygiene/grooming, and locomotion level  PARTICIPATION LIMITATIONS: meal prep, cleaning, laundry, driving, shopping, community activity, occupation, and yard work  PERSONAL FACTORS: high co-pay   REHAB POTENTIAL: Good  CLINICAL DECISION MAKING: Stable/uncomplicated  EVALUATION COMPLEXITY: Low   GOALS: Goals reviewed with patient? Yes  SHORT TERM GOALS: Target date: 08/01/2023   Patient will increase passive knee flexion and 90 degrees Baseline: Goal status: INITIAL  2.  Patient will wean off crutches and increase weightbearing as tolerated Baseline:  Goal status: INITIAL  3.  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  Patient will return to kayaking without pain Baseline:  Goal status: INITIAL  3.  Patient will go up and down 8 steps with reciprocal gait pattern without pain Baseline:  Goal status: INITIAL    PLAN:  PT FREQUENCY: 2x/week  PT DURATION: 8 weeks  PLANNED INTERVENTIONS: 97110-Therapeutic exercises,  97530- Therapeutic activity, [id number]- Neuromuscular re-education, 97535- Self Care, 95621- Manual therapy, [id number]- Gait training, [phone]- Aquatic Therapy, 97014- Electrical stimulation (unattended), 97035- Ultrasound, Patient/Family education, Stair training, Taping, Dry Needling, DME instructions, Cryotherapy, and Moist heat   PLAN FOR NEXT SESSION: Patient's visits may be limited by excessively high co-pay.  Based plan of care on how many visits we will be able to see her for.  Progress range of motion as tolerated.  At 2 weeks to begin weight shifting and weightbearing as tolerated.   Dessie Coma, PT 07/04/2023, 8:50 PM

## 2023-07-05 ENCOUNTER — Ambulatory Visit (HOSPITAL_BASED_OUTPATIENT_CLINIC_OR_DEPARTMENT_OTHER): Payer: 59 | Admitting: Physical Therapy

## 2023-07-09 NOTE — Telephone Encounter (Signed)
 Copied from CRM [id number]. Topic: Clinical - Home Health Verbal Orders >> Jul 09, 2023  3:56 PM Desma Mcgregor wrote: Caller/Agency: Joy Cruz, patient Callback Number: [phone] Service Requested: Physical Therapy at [facility] [phone]  Frequency: Tuesdays and Fridays until 08/09/23 Any new concerns about the patient? No, but this is for left knee.

## 2023-07-11 ENCOUNTER — Other Ambulatory Visit: Payer: Self-pay | Admitting: Family Medicine

## 2023-07-11 DIAGNOSIS — S83242S Other tear of medial meniscus, current injury, left knee, sequela: Secondary | ICD-10-CM

## 2023-07-11 NOTE — Therapy (Signed)
 OUTPATIENT PHYSICAL THERAPY TREATMENT   Patient Name: Joy Cruz MRN: [id number] DOB:1975/11/30, 48 y.o., female Today's Date: 07/12/2023  END OF SESSION:  PT End of Session - 07/12/23 1303     Visit Number 2    Number of Visits 16    Date for PT Re-Evaluation 08/29/23    PT Start Time 1303    PT Stop Time 1348    PT Time Calculation (min) 45 min    Activity Tolerance Patient tolerated treatment well              Past Medical History:  Diagnosis Date   Anemia    Diabetes mellitus without complication (HCC)    Past Surgical History:  Procedure Laterality Date   CESAREAN SECTION     CHONDROPLASTY Left 07/02/2023   Procedure: FEMORAL PATELLA CHONDROPLASTY;  Surgeon: Huel Cote, MD;  Location: [facility];  Service: Orthopedics;  Laterality: Left;   KNEE ARTHROSCOPY WITH MENISCAL REPAIR Left 07/02/2023   Procedure: LEFT KNEE ARTHROSCOPY WITH MENISCAL REPAIR;  Surgeon: Huel Cote, MD;  [facility];  Service: Orthopedics;  Laterality: Left;   Patient Active Problem List   Diagnosis Date Noted   Tear of medial meniscus of left knee, current 07/02/2023   Establishing care with new doctor, encounter for 05/21/2023   Acute pain of left knee 05/21/2023   Diabetes mellitus without complication (HCC) 05/21/2023    PCP: Dorena Bodo FNP  REFERRING PROVIDER: Dr Cristela Felt MD   REFERRING DIAG: 1.  Left knee medial meniscal root repair with centralization 2.  Left knee medial femoral condyle abrasion arthroplasty  THERAPY DIAG:  Acute pain of left knee  Stiffness of left knee, not elsewhere classified  Other abnormalities of gait and mobility  Rationale for Evaluation and Treatment: Rehabilitation  ONSET DATE: 07/02/2023  SUBJECTIVE:   Per eval - Patient is a 48 year old female status post left knee medial meniscal root repair with left knee medial femoral condyle abrasion arthroplasty on 06/04/2023.  The patient was  stepping off a truck in August 2024 when she twisted her knee.  She had continuous pain until her surgical repair.  At this time she is still having high levels of pain.  She reports her pain fluctuates between 7 and 9.  She feels significant stiffness.  SUBJECTIVE STATEMENT: 07/12/2023 Pt arrives w/ crutches and brace, states her pain/swelling is somewhat improved and she is able to move leg better. She does state that she has only been using brace in community and has been putting weight through her leg. States she has not tried her HEP.    PERTINENT HISTORY: DM2, C-section PAIN:  07/12/2023 6/10 medial/anterior knee    Per eval - Are you having pain? Yes: NPRS scale: 7/10 has been a consistant 9-10  Pain location: Medial knee Pain description: Aching Aggravating factors: standing and walking  Relieving factors: rest, ice and pain medication   PRECAUTIONS: None  RED FLAGS: None   WEIGHT BEARING RESTRICTIONS: Yes NWB 2 weeks   FALLS:  Has patient fallen in last 6 months? No  LIVING ENVIRONMENT: No steps into her house  OCCUPATION:   Drives a truck   Hobbies: Data processing manager    PLOF: Independent  PATIENT GOALS:  Get back to regular activity  NEXT MD VISIT:  Next week   OBJECTIVE:  Note: Objective measures were completed at Evaluation unless otherwise noted.  DIAGNOSTIC FINDINGS:    PATIENT SURVEYS:  LEFS 5/80  COGNITION: Overall cognitive status: Within functional limits for tasks assessed     SENSATION: Numbness around the surgical site  EDEMA:  Circumferential: Will measure next visit    POSTURE: forward head  PALPATION: No unexpected tenderness to palpation   LOWER EXTREMITY ROM:  Passive ROM Right eval Left eval Left 07/12/23  Hip flexion     Hip extension     Hip abduction     Hip adduction     Hip internal rotation     Hip external rotation     Knee flexion  30 with physical therapist able to self assist with  strap to 40 degrees AA: 46-48deg  Knee extension  -5   Ankle dorsiflexion     Ankle plantarflexion     Ankle inversion     Ankle eversion      (Blank rows = not tested)  LOWER EXTREMITY MMT:  MMT Right eval Left eval  Hip flexion    Hip extension    Hip abduction    Hip adduction    Hip internal rotation    Hip external rotation    Knee flexion    Knee extension    Ankle dorsiflexion    Ankle plantarflexion    Ankle inversion    Ankle eversion     (Blank rows = not tested) No formal MMT 2nd to recent surgery   GAIT: Reviewed ambulation with crutches nonweightbearing.  Patient has been weightbearing.  She reports at home with time she is 1 crutch.  Per op note patient is nonweightbearing at this time.  Patient strongly advised to continue with nonweightbearing status                                                                                                                               TREATMENT DATE:  [facility] Adult PT Treatment:                                                DATE: 07/12/23 Therapeutic Exercise: Quad set x10 cues for setup and reduced compensations Ankle pumps x15 Heel slides w/ strap x8, seated HEP education/discussion  Gait Training Gait w/ axilary crutches and NWB precautions ; cues and assist to reduce fwd trunk flexion, improving posture, and maintaining appropriate LE mechanics to avoid WB  Self Care: Assist with readjusting brace w/ increased time spent to educate on appropriate alignment/adjustment ; education on AD use, maintaining NWB precautions until cleared by physician, appropriate activity modification and post op restrictions/protocol   Eval 07/04/23: Manual: Gentle passive range of motion into available ranges.  Assessment of range of motion.  Review of self range of motion.  There ex: Reviewed quad set.  Reviewed quad set with opposite leg for muscle firing. Self-care: Redressed patient's dressing.  Refit patient's brace.  Patient's  brace was set low. Gait: See gait section   PATIENT EDUCATION:  Education details: HEP, symptom management, post op precautions and surgical protocol Person educated: Patient Education method: Explanation, Demonstration, Tactile cues, Verbal cues, and Handouts Education comprehension: verbalized understanding, returned demonstration, verbal cues required, tactile cues required, and needs further education  HOME EXERCISE PROGRAM: Access Code: [email or web address] URL: [email or web address] Date: 07/04/2023 Prepared by: Lorayne Bender  Exercises - Supine Quad Set  - 3-4 x daily - 7 x weekly - 3 sets - 10 reps - Supine Heel Slide with Strap  - 3-4 x daily - 7 x weekly - 3 sets - 10 reps  ASSESSMENT:  CLINICAL IMPRESSION: 07/12/2023 Pt arrives w/ report of improving pain (6/10) but has not been using brace full time or maintaining WB precautions. Significant time spent today with education on importance of adhering to post op restrictions, rationale for gradual progression per protocol, discussed phase 1 restrictions per her protocol, assisting with brace fit and self management (she reports tendency for it to slide down leg and this is apparent on arrival today), and working on gait mechanics to improve adherence to WB precautions. She demonstrates modest improvement in knee flexion ROM but remains <50 deg. Limited quad activation but does improve with repetition and tactile cues. No adverse events, pt denies any increase in pain on departure. Recommend continuing along current POC in order to address relevant deficits and improve functional tolerance. Pt departs today's session in no acute distress, all voiced questions/concerns addressed appropriately from PT perspective.    Per eval - Patient is a 48 year old female status post left medial meniscal root repair and femoral condyle chondroplasty on 07/02/2023.  She presents with expected limitations in range of motion, function, and strength,  she is nonweightbearing at this time.  Therapy reviewed gait training with the patient.  Therapy also adjusted the patient's brace to fit better.  She is limited by pain today.  Patient was given a limited HEP to begin working on base range of motion.  She a advised not to move range of motion past 90 degrees.  She is nowhere near 90 degrees at this time.  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  OBJECTIVE IMPAIRMENTS: Abnormal gait, decreased activity tolerance, decreased knowledge of condition, decreased knowledge of use of DME, decreased mobility, difficulty walking, decreased ROM, decreased strength, increased edema, and pain.   ACTIVITY LIMITATIONS: carrying, standing, squatting, sleeping, stairs, transfers, bed mobility, bathing, toileting, dressing, hygiene/grooming, and locomotion level  PARTICIPATION LIMITATIONS: meal prep, cleaning, laundry, driving, shopping, community activity, occupation, and yard work  PERSONAL FACTORS: high co-pay   REHAB POTENTIAL: Good  CLINICAL DECISION MAKING: Stable/uncomplicated  EVALUATION COMPLEXITY: Low   GOALS: Goals reviewed with patient? Yes  SHORT TERM GOALS: Target date: 08/01/2023   Patient will increase passive knee flexion and 90 degrees Baseline: Goal status: INITIAL  2.  Patient will wean off crutches and increase weightbearing as tolerated Baseline:  Goal status: INITIAL  3.  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  Patient will return to kayaking without pain Baseline:  Goal status: INITIAL  3.  Patient will go up and down 8 steps with reciprocal gait pattern without pain Baseline:  Goal status: INITIAL  PLAN:  PT FREQUENCY: 2x/week  PT DURATION: 8 weeks  PLANNED  INTERVENTIONS: 97110-Therapeutic exercises, 97530- Therapeutic activity, [id number]- Neuromuscular re-education, 97535- Self Care, 16109- Manual therapy, [id number]- Gait training, [id number]- Aquatic Therapy, 97014- Electrical stimulation (unattended), 97035- Ultrasound, Patient/Family education, Stair training, Taping, Dry Needling, DME instructions, Cryotherapy, and Moist heat   PLAN FOR NEXT SESSION: Patient's visits may be limited by excessively high co-pay.  Based plan of care on how many visits we will be able to see her for.  Continue per meniscal repair protocol. At 2 weeks to begin weight shifting and weightbearing as tolerated.   Ashley Murrain PT, DPT 07/12/2023 4:14 PM

## 2023-07-12 ENCOUNTER — Telehealth: Admitting: Family Medicine

## 2023-07-12 ENCOUNTER — Encounter (HOSPITAL_BASED_OUTPATIENT_CLINIC_OR_DEPARTMENT_OTHER): Payer: Self-pay | Admitting: Physical Therapy

## 2023-07-12 ENCOUNTER — Ambulatory Visit (HOSPITAL_BASED_OUTPATIENT_CLINIC_OR_DEPARTMENT_OTHER): Payer: 59 | Admitting: Physical Therapy

## 2023-07-12 DIAGNOSIS — M25562 Pain in left knee: Secondary | ICD-10-CM | POA: Diagnosis not present

## 2023-07-12 DIAGNOSIS — R2689 Other abnormalities of gait and mobility: Secondary | ICD-10-CM

## 2023-07-12 DIAGNOSIS — M25662 Stiffness of left knee, not elsewhere classified: Secondary | ICD-10-CM

## 2023-07-15 ENCOUNTER — Ambulatory Visit (INDEPENDENT_AMBULATORY_CARE_PROVIDER_SITE_OTHER): Payer: 59 | Admitting: Orthopaedic Surgery

## 2023-07-15 ENCOUNTER — Other Ambulatory Visit (HOSPITAL_BASED_OUTPATIENT_CLINIC_OR_DEPARTMENT_OTHER): Payer: Self-pay

## 2023-07-15 DIAGNOSIS — S83242A Other tear of medial meniscus, current injury, left knee, initial encounter: Secondary | ICD-10-CM

## 2023-07-15 MED ORDER — OXYCODONE HCL 5 MG PO TABS
5.0000 mg | ORAL_TABLET | ORAL | 0 refills | Status: DC | PRN
  Filled 2023-07-15: qty 15, 3d supply, fill #0

## 2023-07-15 MED ORDER — IBUPROFEN 800 MG PO TABS
800.0000 mg | ORAL_TABLET | Freq: Three times a day (TID) | ORAL | 0 refills | Status: DC
  Filled 2023-07-15: qty 30, 10d supply, fill #0

## 2023-07-15 NOTE — Progress Notes (Signed)
 Post Operative Evaluation    Procedure/Date of Surgery: Left knee medial meniscal root repair 3/18  Interval History:    Resents today 2 weeks status post the above procedure.  Overall she is doing well.  She is working with physical therapy.  She has been limiting weightbearing with crutches as well as compliant with brace   PMH/PSH/Family History/Social History/Meds/Allergies:    Past Medical History:  Diagnosis Date   Anemia    Diabetes mellitus without complication (HCC)    Past Surgical History:  Procedure Laterality Date   CESAREAN SECTION     CHONDROPLASTY Left 07/02/2023   Procedure: FEMORAL PATELLA CHONDROPLASTY;  Surgeon: Huel Cote, MD;  Location: [facility];  Service: Orthopedics;  Laterality: Left;   KNEE ARTHROSCOPY WITH MENISCAL REPAIR Left 07/02/2023   Procedure: LEFT KNEE ARTHROSCOPY WITH MENISCAL REPAIR;  Surgeon: Huel Cote, MD;  Location: [facility];  Service: Orthopedics;  Laterality: Left;   Social History   Socioeconomic History   Marital status: Single    Spouse name: Not on file   Number of children: 3   Years of education: Not on file   Highest education level: Not on file  Occupational History   Not on file  Tobacco Use   Smoking status: Some Days    Types: Pipe    Passive exposure: Past   Smokeless tobacco: Never   Tobacco comments:    ceremonial reasons (only in July)  Vaping Use   Vaping status: Never Used  Substance and Sexual Activity   Alcohol use: Never   Drug use: Never   Sexual activity: Yes    Birth control/protection: None  Other Topics Concern   Not on file  Social History Narrative   Not on file   Social Drivers of Health   Financial Resource Strain: Not on file  Food Insecurity: Not on file  Transportation Needs: Not on file  Physical Activity: Not on file  Stress: Not on file  Social Connections: Not on file   No family history on  file. No Known Allergies Current Outpatient Medications  Medication Sig Dispense Refill   ibuprofen (ADVIL) 800 MG tablet Take 1 tablet (800 mg total) by mouth every 8 (eight) hours for 10 days. Please take with food, please alternate with acetaminophen 30 tablet 0   oxyCODONE (ROXICODONE) 5 MG immediate release tablet Take 1 tablet (5 mg total) by mouth every 4 (four) hours as needed. 15 tablet 0   acetaminophen (TYLENOL) 500 MG tablet Take 1,000 mg by mouth every 6 (six) hours as needed for moderate pain or headache.     IRON PO Take 1 tablet by mouth 2 (two) times daily.     metFORMIN (GLUCOPHAGE) 500 MG tablet Take 500 mg by mouth 2 (two) times daily with a meal.     No current facility-administered medications for this visit.   No results found.  Review of Systems:   A ROS was performed including pertinent positives and negatives as documented in the HPI.   Musculoskeletal Exam:    Last menstrual period 04/05/2023.  Left knee portals are well-appearing without erythema or drainage.  Mild knee effusion.  Range of motion is from 0-45.  She has good quad strength  Imaging:      I personally reviewed and interpreted  the radiographs.   Assessment:   2 weeks status post left knee medial meniscal root repair doing well.  At this time would like her to advance her brace and off of her crutches over the course the next 2 weeks.  I will plan to see her back in 4 weeks for reassessment  Plan :    -Turn to clinic 4 weeks for reassessment      I personally saw and evaluated the patient, and participated in the management and treatment plan.  Huel Cote, MD Attending Physician, Orthopedic Surgery  This document was dictated using Dragon voice recognition software. A reasonable attempt at proof reading has been made to minimize errors.

## 2023-07-18 NOTE — Therapy (Signed)
 OUTPATIENT PHYSICAL THERAPY TREATMENT   Patient Name: Joy Cruz MRN: [id number] DOB:30-Sep-1975, 48 y.o., female Today's Date: 07/19/2023  END OF SESSION:  PT End of Session - 07/19/23 1310     Visit Number 3    Number of Visits 16    Date for PT Re-Evaluation 08/29/23    PT Start Time 1311   late check in   PT Stop Time 1347    PT Time Calculation (min) 36 min    Activity Tolerance Patient tolerated treatment well               Past Medical History:  Diagnosis Date   Anemia    Diabetes mellitus without complication (HCC)    Past Surgical History:  Procedure Laterality Date   CESAREAN SECTION     CHONDROPLASTY Left 07/02/2023   Procedure: FEMORAL PATELLA CHONDROPLASTY;  Surgeon: Huel Cote, MD;  Location: [facility];  Service: Orthopedics;  Laterality: Left;   KNEE ARTHROSCOPY WITH MENISCAL REPAIR Left 07/02/2023   Procedure: LEFT KNEE ARTHROSCOPY WITH MENISCAL REPAIR;  Surgeon: Huel Cote, MD;  Location:  [facility];  Service: Orthopedics;  Laterality: Left;   Patient Active Problem List   Diagnosis Date Noted   Tear of medial meniscus of left knee, current 07/02/2023   Establishing care with new doctor, encounter for 05/21/2023   Acute pain of left knee 05/21/2023   Diabetes mellitus without complication (HCC) 05/21/2023    PCP: Dorena Bodo FNP  REFERRING PROVIDER: Dr Cristela Felt MD   REFERRING DIAG: 1.  Left knee medial meniscal root repair with centralization 2.  Left knee medial femoral condyle abrasion arthroplasty  THERAPY DIAG:  Acute pain of left knee  Stiffness of left knee, not elsewhere classified  Other abnormalities of gait and mobility  Rationale for Evaluation and Treatment: Rehabilitation  ONSET DATE: 07/02/2023  SUBJECTIVE:   Per eval - Patient is a 48 year old female status post left knee medial meniscal root repair with left knee medial femoral condyle abrasion arthroplasty on 06/04/2023.   The patient was stepping off a truck in August 2024 when she twisted her knee.  She had continuous pain until her surgical repair.  At this time she is still having high levels of pain.  She reports her pain fluctuates between 7 and 9.  She feels significant stiffness.  SUBJECTIVE STATEMENT: 07/19/2023 Pt arrives w/ crutches and brace, states surgeon visit went well, was encouraged tostart WB and wean off crutches after a couple weeks. Slept without brace and had some increased pain but better now, although is still a bit more swollen   PERTINENT HISTORY: DM2, C-section PAIN:  07/19/2023 6/10 medial/anterior knee    Per eval - Are you having pain? Yes: NPRS scale: 7/10 has been a consistant 9-10  Pain location: Medial knee Pain description: Aching Aggravating factors: standing and walking  Relieving factors: rest, ice and pain medication   PRECAUTIONS: None  RED FLAGS: None   WEIGHT BEARING RESTRICTIONS: Yes NWB 2 weeks   FALLS:  Has patient fallen in last 6 months? No  LIVING ENVIRONMENT: No steps into her house  OCCUPATION:   Drives a truck   Hobbies: Data processing manager    PLOF: Independent  PATIENT GOALS:  Get back to regular activity  NEXT MD VISIT:  Next week   OBJECTIVE:  Note: Objective measures were completed at Evaluation unless otherwise noted.  DIAGNOSTIC FINDINGS:    PATIENT SURVEYS:  LEFS 5/80  COGNITION:  Overall cognitive status: Within functional limits for tasks assessed     SENSATION: Numbness around the surgical site  EDEMA:  Circumferential: Will measure next visit    POSTURE: forward head  PALPATION: No unexpected tenderness to palpation   LOWER EXTREMITY ROM:  Passive ROM Right eval Left eval Left 07/12/23 Left 07/19/23  Hip flexion      Hip extension      Hip abduction      Hip adduction      Hip internal rotation      Hip external rotation      Knee flexion  30 with physical therapist able to self  assist with strap to 40 degrees AA: 46-48deg AA: 71 deg seated w/ strap   Knee extension  -5    Ankle dorsiflexion      Ankle plantarflexion      Ankle inversion      Ankle eversion       (Blank rows = not tested)  LOWER EXTREMITY MMT:  MMT Right eval Left eval  Hip flexion    Hip extension    Hip abduction    Hip adduction    Hip internal rotation    Hip external rotation    Knee flexion    Knee extension    Ankle dorsiflexion    Ankle plantarflexion    Ankle inversion    Ankle eversion     (Blank rows = not tested) No formal MMT 2nd to recent surgery   GAIT: Reviewed ambulation with crutches nonweightbearing.  Patient has been weightbearing.  She reports at home with time she is 1 crutch.  Per op note patient is nonweightbearing at this time.  Patient strongly advised to continue with nonweightbearing status                                                                                                                               TREATMENT DATE:  [facility] Adult PT Treatment:                                                DATE: 07/19/23 Therapeutic Exercise: Quad set w/ towel 3x10 significant improvement in contraction  Heel slides x10 cues for comfortable ROM Ankle pumps x20 Attempting PT assisted SLR in brace, discontinued due to difficulty HEP education/discussion, increased time spent w/ education on importance of quad sets for knee stability  Self Care: Continued education on gradual progression per protocol, strategies to improve safety/comfort w/ mobility, and brace management   [facility] Adult PT Treatment:                                                DATE:  07/12/23 Therapeutic Exercise: Quad set x10 cues for setup and reduced compensations Ankle pumps x15 Heel slides w/ strap x8, seated HEP education/discussion  Gait Training Gait w/ axilary crutches and NWB precautions ; cues and assist to reduce fwd trunk flexion, improving posture, and maintaining appropriate  LE mechanics to avoid WB  Self Care: Assist with readjusting brace w/ increased time spent to educate on appropriate alignment/adjustment ; education on AD use, maintaining NWB precautions until cleared by physician, appropriate activity modification and post op restrictions/protocol   Eval 07/04/23: Manual: Gentle passive range of motion into available ranges.  Assessment of range of motion.  Review of self range of motion.  There ex: Reviewed quad set.  Reviewed quad set with opposite leg for muscle firing. Self-care: Redressed patient's dressing.  Refit patient's brace.  Patient's brace was set low. Gait: See gait section   PATIENT EDUCATION:  Education details: HEP, symptom management, post op precautions and surgical protocol Person educated: Patient Education method: Explanation, Demonstration, Tactile cues, Verbal cues, and Handouts Education comprehension: verbalized understanding, returned demonstration, verbal cues required, tactile cues required, and needs further education  HOME EXERCISE PROGRAM: Access Code: [email or web address] URL: [email or web address] Date: 07/04/2023 Prepared by: Lorayne Bender  Exercises - Supine Quad Set  - 3-4 x daily - 7 x weekly - 3 sets - 10 reps - Supine Heel Slide with Strap  - 3-4 x daily - 7 x weekly - 3 sets - 10 reps  ASSESSMENT:  CLINICAL IMPRESSION: 07/19/2023 Pt arrives w 6/10 pain reports surgeon visit went well, was encouraged to start to wean out of brace and crutches, cleared to WB. She continues to demonstrate fairly significant deficits in quad activation so we spend much of session working on comfortable set up with this, verbal/tactile cues improve this notably with repetition. Significant improvement in knee flexion AAROM. No adverse events, pt tolerates session well, education as above. Recommend continuing along current POC in order to address relevant deficits and improve functional tolerance. Pt departs today's session in  no acute distress, all voiced questions/concerns addressed appropriately from PT perspective.     Per eval - Patient is a 48 year old female status post left medial meniscal root repair and femoral condyle chondroplasty on 07/02/2023.  She presents with expected limitations in range of motion, function, and strength, she is nonweightbearing at this time.  Therapy reviewed gait training with the patient.  Therapy also adjusted the patient's brace to fit better.  She is limited by pain today.  Patient was given a limited HEP to begin working on base range of motion.  She a advised not to move range of motion past 90 degrees.  She is nowhere near 90 degrees at this time.  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  OBJECTIVE IMPAIRMENTS: Abnormal gait, decreased activity tolerance, decreased knowledge of condition, decreased knowledge of use of DME, decreased mobility, difficulty walking, decreased ROM, decreased strength, increased edema, and pain.   ACTIVITY LIMITATIONS: carrying, standing, squatting, sleeping, stairs, transfers, bed mobility, bathing, toileting, dressing, hygiene/grooming, and locomotion level  PARTICIPATION LIMITATIONS: meal prep, cleaning, laundry, driving, shopping, community activity, occupation, and yard work  PERSONAL FACTORS: high co-pay   REHAB POTENTIAL: Good  CLINICAL DECISION MAKING: Stable/uncomplicated  EVALUATION COMPLEXITY: Low   GOALS: Goals reviewed with patient? Yes  SHORT TERM GOALS: Target date: 08/01/2023   Patient will increase passive knee flexion and 90 degrees Baseline: Goal status: INITIAL  2.  Patient will wean off crutches and increase weightbearing as tolerated Baseline:  Goal status: INITIAL  3.  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  Patient will return to kayaking without pain Baseline:  Goal status: INITIAL  3.  Patient will go up and down 8 steps with reciprocal gait pattern without pain Baseline:  Goal status: INITIAL    PLAN:  PT FREQUENCY: 2x/week  PT DURATION: 8 weeks  PLANNED INTERVENTIONS: 97110-Therapeutic exercises, 97530- Therapeutic activity, [id number]- Neuromuscular re-education, 97535- Self Care, 16109- Manual therapy, [id number]- Gait training, [phone]- Aquatic Therapy, 97014- Electrical stimulation (unattended), 97035- Ultrasound, Patient/Family education, Stair training, Taping, Dry Needling, DME instructions, Cryotherapy, and Moist heat   PLAN FOR NEXT SESSION: Patient's visits may be limited by excessively high co-pay.  Based plan of care on how many visits we will be able to see her for.  Continue per meniscal repair protocol.    Ashley Murrain PT, DPT 07/19/2023 4:24 PM

## 2023-07-19 ENCOUNTER — Ambulatory Visit (HOSPITAL_BASED_OUTPATIENT_CLINIC_OR_DEPARTMENT_OTHER): Payer: 59 | Attending: Orthopaedic Surgery | Admitting: Physical Therapy

## 2023-07-19 ENCOUNTER — Encounter (HOSPITAL_BASED_OUTPATIENT_CLINIC_OR_DEPARTMENT_OTHER): Payer: Self-pay | Admitting: Physical Therapy

## 2023-07-19 DIAGNOSIS — M25662 Stiffness of left knee, not elsewhere classified: Secondary | ICD-10-CM | POA: Insufficient documentation

## 2023-07-19 DIAGNOSIS — M25562 Pain in left knee: Secondary | ICD-10-CM | POA: Diagnosis present

## 2023-07-19 DIAGNOSIS — R2689 Other abnormalities of gait and mobility: Secondary | ICD-10-CM | POA: Insufficient documentation

## 2023-07-26 ENCOUNTER — Ambulatory Visit (HOSPITAL_BASED_OUTPATIENT_CLINIC_OR_DEPARTMENT_OTHER): Payer: 59 | Admitting: Physical Therapy

## 2023-07-26 DIAGNOSIS — M25662 Stiffness of left knee, not elsewhere classified: Secondary | ICD-10-CM

## 2023-07-26 DIAGNOSIS — M25562 Pain in left knee: Secondary | ICD-10-CM | POA: Diagnosis not present

## 2023-07-26 DIAGNOSIS — R2689 Other abnormalities of gait and mobility: Secondary | ICD-10-CM

## 2023-07-26 NOTE — Therapy (Signed)
 OUTPATIENT PHYSICAL THERAPY TREATMENT   Patient Name: Joy Cruz MRN: [id number] DOB:[birth year], 48 y.o., female Today's Date: 07/26/2023  END OF SESSION:      Past Medical History:  Diagnosis Date   Anemia    Diabetes mellitus without complication (HCC)    Past Surgical History:  Procedure Laterality Date   CESAREAN SECTION     CHONDROPLASTY Left 07/02/2023   Procedure: FEMORAL PATELLA CHONDROPLASTY;  Surgeon: Huel Cote, MD;  Location: [facility];  Service: Orthopedics;  Laterality: Left;   KNEE ARTHROSCOPY WITH MENISCAL REPAIR Left 07/02/2023   Procedure: LEFT KNEE ARTHROSCOPY WITH MENISCAL REPAIR;  Surgeon: Huel Cote, MD;  Location: [facility];  Service: Orthopedics;  Laterality: Left;   Patient Active Problem List   Diagnosis Date Noted   Tear of medial meniscus of left knee, current 07/02/2023   Establishing care with new doctor, encounter for 05/21/2023   Acute pain of left knee 05/21/2023   Diabetes mellitus without complication (HCC) 05/21/2023    PCP: Dorena Bodo FNP  REFERRING PROVIDER: Dr Cristela Felt MD   REFERRING DIAG: 1.  Left knee medial meniscal root repair with centralization 2.  Left knee medial femoral condyle abrasion arthroplasty  THERAPY DIAG:  No diagnosis found.  Rationale for Evaluation and Treatment: Rehabilitation  ONSET DATE: 07/02/2023 (meniscus repair surgery)  SUBJECTIVE:   Per eval - Patient is a 48 year old female status post left knee medial meniscal root repair with left knee medial femoral condyle abrasion arthroplasty on 06/04/2023.  The patient was stepping off a truck in August 2024 when she twisted her knee.  She had continuous pain until her surgical repair.  At this time she is still having high levels of pain.  She reports her pain fluctuates between 7 and 9.  She feels significant stiffness.  SUBJECTIVE STATEMENT: 07/26/2023 Pt states her pain has been improving overall. Getting  a little more motion. No problems with exercises at home.    PERTINENT HISTORY: DM2, C-section PAIN:  07/26/2023 6/10 medial/anterior knee    Per eval - Are you having pain? Yes: NPRS scale: 7/10 has been a consistant 9-10  Pain location: Medial knee Pain description: Aching Aggravating factors: standing and walking  Relieving factors: rest, ice and pain medication   PRECAUTIONS: None  RED FLAGS: None   WEIGHT BEARING RESTRICTIONS: Yes NWB 2 weeks   FALLS:  Has patient fallen in last 6 months? No  LIVING ENVIRONMENT: No steps into her house  OCCUPATION:   Drives a truck   Hobbies: Data processing manager    PLOF: Independent  PATIENT GOALS:  Get back to regular activity  NEXT MD VISIT:  Next week   OBJECTIVE:  Note: Objective measures were completed at Evaluation unless otherwise noted.  DIAGNOSTIC FINDINGS:    PATIENT SURVEYS:  LEFS 5/80  COGNITION: Overall cognitive status: Within functional limits for tasks assessed     SENSATION: Numbness around the surgical site  EDEMA:  Circumferential: Will measure next visit    POSTURE: forward head  PALPATION: No unexpected tenderness to palpation   LOWER EXTREMITY ROM:  Passive ROM Right eval Left eval Left 07/12/23 Left 07/19/23  Hip flexion      Hip extension      Hip abduction      Hip adduction      Hip internal rotation      Hip external rotation      Knee flexion  30 with physical therapist able to  self assist with strap to 40 degrees AA: 46-48deg AA: 71 deg seated w/ strap   Knee extension  -5    Ankle dorsiflexion      Ankle plantarflexion      Ankle inversion      Ankle eversion       (Blank rows = not tested)  LOWER EXTREMITY MMT:  MMT Right eval Left eval  Hip flexion    Hip extension    Hip abduction    Hip adduction    Hip internal rotation    Hip external rotation    Knee flexion    Knee extension    Ankle dorsiflexion    Ankle plantarflexion     Ankle inversion    Ankle eversion     (Blank rows = not tested) No formal MMT 2nd to recent surgery   GAIT: Reviewed ambulation with crutches nonweightbearing.  Patient has been weightbearing.  She reports at home with time she is 1 crutch.  Per op note patient is nonweightbearing at this time.  Patient strongly advised to continue with nonweightbearing status                                                                                                                               TREATMENT DATE:  [facility] Adult PT Treatment:                                                DATE: 07/26/23 Therapeutic Exercise: Heel slide with strap x10 with PT MWM for knee flexion Quad set w/ towel 3x10 SAQ with bolster 2x10 Supine hip abd 2x10 Supine knee extended glute set 2x10 Manual Therapy: Patellar mobilization IASTM L quad Gait: Weight shift R&L  Staggered stance weight shift fwd/bwd  Readjusted crutch and worked on amb with single crutch to decrease push into pt's arm pit and decrease her N/T Self Care: Discussed icing, compression, brace management, protocol   [facility] Adult PT Treatment:                                                DATE: 07/19/23 Therapeutic Exercise: Quad set w/ towel 3x10 significant improvement in contraction  Heel slides x10 cues for comfortable ROM Ankle pumps x20 Attempting PT assisted SLR in brace, discontinued due to difficulty HEP education/discussion, increased time spent w/ education on importance of quad sets for knee stability  Self Care: Continued education on gradual progression per protocol, strategies to improve safety/comfort w/ mobility, and brace management   [facility] Adult PT Treatment:  DATE: 07/12/23 Therapeutic Exercise: Quad set x10 cues for setup and reduced compensations Ankle pumps x15 Heel slides w/ strap x8, seated HEP education/discussion  Gait Training Gait w/ axilary crutches and NWB  precautions ; cues and assist to reduce fwd trunk flexion, improving posture, and maintaining appropriate LE mechanics to avoid WB  Self Care: Assist with readjusting brace w/ increased time spent to educate on appropriate alignment/adjustment ; education on AD use, maintaining NWB precautions until cleared by physician, appropriate activity modification and post op restrictions/protocol   Eval 07/04/23: Manual: Gentle passive range of motion into available ranges.  Assessment of range of motion.  Review of self range of motion.  There ex: Reviewed quad set.  Reviewed quad set with opposite leg for muscle firing. Self-care: Redressed patient's dressing.  Refit patient's brace.  Patient's brace was set low. Gait: See gait section   PATIENT EDUCATION:  Education details: HEP, symptom management, post op precautions and surgical protocol Person educated: Patient Education method: Explanation, Demonstration, Tactile cues, Verbal cues, and Handouts Education comprehension: verbalized understanding, returned demonstration, verbal cues required, tactile cues required, and needs further education  HOME EXERCISE PROGRAM: Access Code: [email or web address] URL: [email or web address] Date: 07/26/2023 Prepared by: Vernon Prey Narcisa Kirstie Peri  Exercises - Supine Quad Set  - 3-4 x daily - 7 x weekly - 3 sets - 10 reps - Supine Heel Slide with Strap  - 3-4 x daily - 7 x weekly - 3 sets - 10 reps - Supine Knee Extension Strengthening  - 1 x daily - 7 x weekly - 3 sets - 10 reps - Supine Gluteal Sets  - 1 x daily - 7 x weekly - 3 sets - 10 reps - 4 Way Patellar Glide  - 1 x daily - 7 x weekly - 1 sets - 10 reps - Staggered Stance Forward Backward Weight Shift with Unilateral Counter Support  - 1 x daily - 7 x weekly - 2 sets - 10 reps  ASSESSMENT:  CLINICAL IMPRESSION: 07/26/2023 Pt with continued deficits in knee ROM. Encouraged pt to try and do her ROM exercises daily as she is so stiff. Educated  her on compression to continue to decrease her edema. Worked on weight shifting and weight bearing with quad activation. Continued to work on improving quad strength. Provided manual to patella as this also appears quite hypomobile. Had to readjust her crutches once more to decrease too much pressure going into her arm pit causing R UE N/T and weakness. Discussed with pt that per protocol she is okay to have her hinged knee brace unlocked from 0 to 90 deg; however, she remains hesitant with this and wishes to keep it locked in extension for now.    Per eval - Patient is a 48 year old female status post left medial meniscal root repair and femoral condyle chondroplasty on 07/02/2023.  She presents with expected limitations in range of motion, function, and strength, she is nonweightbearing at this time.  Therapy reviewed gait training with the patient.  Therapy also adjusted the patient's brace to fit better.  She is limited by pain today.  Patient was given a limited HEP to begin working on base range of motion.  She a advised not to move range of motion past 90 degrees.  She is nowhere near 90 degrees at this time.  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  OBJECTIVE IMPAIRMENTS: Abnormal gait, decreased activity tolerance,  decreased knowledge of condition, decreased knowledge of use of DME, decreased mobility, difficulty walking, decreased ROM, decreased strength, increased edema, and pain.   ACTIVITY LIMITATIONS: carrying, standing, squatting, sleeping, stairs, transfers, bed mobility, bathing, toileting, dressing, hygiene/grooming, and locomotion level  PARTICIPATION LIMITATIONS: meal prep, cleaning, laundry, driving, shopping, community activity, occupation, and yard work  PERSONAL FACTORS: high co-pay   REHAB POTENTIAL: Good  CLINICAL DECISION MAKING: Stable/uncomplicated  EVALUATION COMPLEXITY: Low   GOALS: Goals  reviewed with patient? Yes  SHORT TERM GOALS: Target date: 08/01/2023   Patient will increase passive knee flexion and 90 degrees Baseline: Goal status: INITIAL  2.  Patient will wean off crutches and increase weightbearing as tolerated Baseline:  Goal status: INITIAL  3.  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  Patient will return to kayaking without pain Baseline:  Goal status: INITIAL  3.  Patient will go up and down 8 steps with reciprocal gait pattern without pain Baseline:  Goal status: INITIAL    PLAN:  PT FREQUENCY: 2x/week  PT DURATION: 8 weeks  PLANNED INTERVENTIONS: 97110-Therapeutic exercises, 97530- Therapeutic activity, [id number]- Neuromuscular re-education, 97535- Self Care, 16109- Manual therapy, [id number]- Gait training, [phone]- Aquatic Therapy, 97014- Electrical stimulation (unattended), 97035- Ultrasound, Patient/Family education, Stair training, Taping, Dry Needling, DME instructions, Cryotherapy, and Moist heat   PLAN FOR NEXT SESSION: Patient's visits may be limited by excessively high co-pay.  Based plan of care on how many visits we will be able to see her for.  Continue per meniscal repair protocol.    Jakyle Petrucelli Tanaysia Ma L Estrella Alcaraz, PT, DPT 07/26/2023 12:47 PM

## 2023-07-30 ENCOUNTER — Encounter (HOSPITAL_BASED_OUTPATIENT_CLINIC_OR_DEPARTMENT_OTHER): Payer: Self-pay

## 2023-07-30 ENCOUNTER — Ambulatory Visit (HOSPITAL_BASED_OUTPATIENT_CLINIC_OR_DEPARTMENT_OTHER): Payer: 59

## 2023-07-30 DIAGNOSIS — M25662 Stiffness of left knee, not elsewhere classified: Secondary | ICD-10-CM

## 2023-07-30 DIAGNOSIS — M25562 Pain in left knee: Secondary | ICD-10-CM | POA: Diagnosis not present

## 2023-07-30 DIAGNOSIS — R2689 Other abnormalities of gait and mobility: Secondary | ICD-10-CM

## 2023-07-30 NOTE — Therapy (Signed)
 OUTPATIENT PHYSICAL THERAPY TREATMENT   Patient Name: Joy Cruz MRN: [id number] DOB:June 19, 1975, 48 y.o., female Today's Date: 07/30/2023  END OF SESSION:  PT End of Session - 07/30/23 1300     Visit Number 5    Number of Visits 16    Date for PT Re-Evaluation 08/29/23    PT Start Time 1304    PT Stop Time 1348    PT Time Calculation (min) 44 min    Activity Tolerance Patient tolerated treatment well    Behavior During Therapy [facility] for tasks assessed/performed                Past Medical History:  Diagnosis Date   Anemia    Diabetes mellitus without complication (HCC)    Past Surgical History:  Procedure Laterality Date   CESAREAN SECTION     CHONDROPLASTY Left 07/02/2023   Procedure: FEMORAL PATELLA CHONDROPLASTY;  Surgeon: Huel Cote, MD;  Location: [facility];  Service: Orthopedics;  Laterality: Left;   KNEE ARTHROSCOPY WITH MENISCAL REPAIR Left 07/02/2023   Procedure: LEFT KNEE ARTHROSCOPY WITH MENISCAL REPAIR;  Surgeon: Huel Cote, MD;  Location: [facility];  Service: Orthopedics;  Laterality: Left;   Patient Active Problem List   Diagnosis Date Noted   Tear of medial meniscus of left knee, current 07/02/2023   Establishing care with new doctor, encounter for 05/21/2023   Acute pain of left knee 05/21/2023   Diabetes mellitus without complication (HCC) 05/21/2023    PCP: Dorena Bodo FNP  REFERRING PROVIDER: Dr Cristela Felt MD   REFERRING DIAG: 1.  Left knee medial meniscal root repair with centralization 2.  Left knee medial femoral condyle abrasion arthroplasty  THERAPY DIAG:  Other abnormalities of gait and mobility  Stiffness of left knee, not elsewhere classified  Acute pain of left knee  Rationale for Evaluation and Treatment: Rehabilitation  ONSET DATE: 07/02/2023 (meniscus repair surgery)  SUBJECTIVE:   Per eval - Patient is a 48 year old female status post left knee medial meniscal root repair  with left knee medial femoral condyle abrasion arthroplasty on 06/04/2023.  The patient was stepping off a truck in August 2024 when she twisted her knee.  She had continuous pain until her surgical repair.  At this time she is still having high levels of pain.  She reports her pain fluctuates between 7 and 9.  She feels significant stiffness.  SUBJECTIVE STATEMENT: 07/30/2023 Pt reports she can tel she is making progress. "My thigh muscle is starting to work again." Arrives with brace donned and single crutch. 5/10 pain level at entry. Still not sleeping well.    PERTINENT HISTORY: DM2, C-section PAIN:  07/30/2023 6/10 medial/anterior knee    Per eval - Are you having pain? Yes: NPRS scale: 5/10 has been a consistant 9-10  Pain location: Medial knee Pain description: Aching Aggravating factors: standing and walking  Relieving factors: rest, ice and pain medication   PRECAUTIONS: None  RED FLAGS: None   WEIGHT BEARING RESTRICTIONS: Yes NWB 2 weeks   FALLS:  Has patient fallen in last 6 months? No  LIVING ENVIRONMENT: No steps into her house  OCCUPATION:   Drives a truck   Hobbies: Data processing manager    PLOF: Independent  PATIENT GOALS:  Get back to regular activity  NEXT MD VISIT:  Next week   OBJECTIVE:  Note: Objective measures were completed at Evaluation unless otherwise noted.  DIAGNOSTIC FINDINGS:    PATIENT SURVEYS:  LEFS  5/80  COGNITION: Overall cognitive status: Within functional limits for tasks assessed     SENSATION: Numbness around the surgical site  EDEMA:  Circumferential: Will measure next visit    POSTURE: forward head  PALPATION: No unexpected tenderness to palpation   LOWER EXTREMITY ROM:  Passive ROM Right eval Left eval Left 07/12/23 Left 07/19/23 Left  4/15   Hip flexion       Hip extension       Hip abduction       Hip adduction       Hip internal rotation       Hip external rotation       Knee  flexion  30 with physical therapist able to self assist with strap to 40 degrees AA: 46-48deg AA: 71 deg seated w/ strap  AA: 94deg supine with strap  Knee extension  -5   -4  Ankle dorsiflexion       Ankle plantarflexion       Ankle inversion       Ankle eversion        (Blank rows = not tested)  LOWER EXTREMITY MMT:  MMT Right eval Left eval  Hip flexion    Hip extension    Hip abduction    Hip adduction    Hip internal rotation    Hip external rotation    Knee flexion    Knee extension    Ankle dorsiflexion    Ankle plantarflexion    Ankle inversion    Ankle eversion     (Blank rows = not tested) No formal MMT 2nd to recent surgery   GAIT: Reviewed ambulation with crutches nonweightbearing.  Patient has been weightbearing.  She reports at home with time she is 1 crutch.  Per op note patient is nonweightbearing at this time.  Patient strongly advised to continue with nonweightbearing status                                                                                                                               TREATMENT DATE:   [facility] Adult PT Treatment:                                                DATE: 07/30/23 Therapeutic Exercise: PROM L knee Updated measurements Heel slide with strap x10 with PT MWM for knee flexion Quad set w/ towel 3x10 Sidelying hip abduction 2x10 Supine SLR with minA x10, no assist x10 Long sit HSS  30sec x3  Manual Therapy: STM to L quad     [facility] Adult PT Treatment:  DATE: 07/26/23 Therapeutic Exercise: Heel slide with strap x10 with PT MWM for knee flexion Quad set w/ towel 3x10 SAQ with bolster 2x10 Supine hip abd 2x10 Supine knee extended glute set 2x10 Manual Therapy: Patellar mobilization IASTM L quad Gait: Weight shift R&L  Staggered stance weight shift fwd/bwd  Readjusted crutch and worked on amb with single crutch to decrease push into pt's arm pit and decrease her  N/T Self Care: Discussed icing, compression, brace management, protocol   [facility] Adult PT Treatment:                                                DATE: 07/19/23 Therapeutic Exercise: Quad set w/ towel 3x10 significant improvement in contraction  Heel slides x10 cues for comfortable ROM Ankle pumps x20 Attempting PT assisted SLR in brace, discontinued due to difficulty HEP education/discussion, increased time spent w/ education on importance of quad sets for knee stability  Self Care: Continued education on gradual progression per protocol, strategies to improve safety/comfort w/ mobility, and brace management   [facility] Adult PT Treatment:                                                DATE: 07/12/23 Therapeutic Exercise: Quad set x10 cues for setup and reduced compensations Ankle pumps x15 Heel slides w/ strap x8, seated HEP education/discussion  Gait Training Gait w/ axilary crutches and NWB precautions ; cues and assist to reduce fwd trunk flexion, improving posture, and maintaining appropriate LE mechanics to avoid WB  Self Care: Assist with readjusting brace w/ increased time spent to educate on appropriate alignment/adjustment ; education on AD use, maintaining NWB precautions until cleared by physician, appropriate activity modification and post op restrictions/protocol   Eval 07/04/23: Manual: Gentle passive range of motion into available ranges.  Assessment of range of motion.  Review of self range of motion.  There ex: Reviewed quad set.  Reviewed quad set with opposite leg for muscle firing. Self-care: Redressed patient's dressing.  Refit patient's brace.  Patient's brace was set low. Gait: See gait section   PATIENT EDUCATION:  Education details: HEP, symptom management, post op precautions and surgical protocol Person educated: Patient Education method: Explanation, Demonstration, Tactile cues, Verbal cues, and Handouts Education comprehension: verbalized  understanding, returned demonstration, verbal cues required, tactile cues required, and needs further education  HOME EXERCISE PROGRAM: Access Code: [email or web address] URL: [email or web address] Date: 07/26/2023 Prepared by: Vernon Prey Lameisha Kirstie Peri  Exercises - Supine Quad Set  - 3-4 x daily - 7 x weekly - 3 sets - 10 reps - Supine Heel Slide with Strap  - 3-4 x daily - 7 x weekly - 3 sets - 10 reps - Supine Knee Extension Strengthening  - 1 x daily - 7 x weekly - 3 sets - 10 reps - Supine Gluteal Sets  - 1 x daily - 7 x weekly - 3 sets - 10 reps - 4 Way Patellar Glide  - 1 x daily - 7 x weekly - 1 sets - 10 reps - Staggered Stance Forward Backward Weight Shift with Unilateral Counter Support  - 1 x daily - 7 x weekly - 2 sets - 10 reps  ASSESSMENT:  CLINICAL IMPRESSION: 07/30/2023 Pt  with good tolerance for L knee PROM. Measured at 94 AA knee flexion and -4 knee extension today.  Instructed patient in long sitting hamstring stretch to assist with increasing available knee extension.  Quad activation remains challenging for her on L side, but mild contraction palpated with quad sets.  Trialed supine SLR with clinician assist.  She gradually improved with quad control during this and did not require assist for second set though mild extension deficit present.  Inspected distal incision as patient reports tightness here all incisions are healing well without concern.  Instructed in gentle construction massage surrounding incisions though instructed to avoid pressure directly on incisions until further healed.   Per eval - Patient is a 48 year old female status post left medial meniscal root repair and femoral condyle chondroplasty on 07/02/2023.  She presents with expected limitations in range of motion, function, and strength, she is nonweightbearing at this time.  Therapy reviewed gait training with the patient.  Therapy also adjusted the patient's brace to fit better.  She is limited by  pain today.  Patient was given a limited HEP to begin working on base range of motion.  She a advised not to move range of motion past 90 degrees.  She is nowhere near 90 degrees at this time.  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  OBJECTIVE IMPAIRMENTS: Abnormal gait, decreased activity tolerance, decreased knowledge of condition, decreased knowledge of use of DME, decreased mobility, difficulty walking, decreased ROM, decreased strength, increased edema, and pain.   ACTIVITY LIMITATIONS: carrying, standing, squatting, sleeping, stairs, transfers, bed mobility, bathing, toileting, dressing, hygiene/grooming, and locomotion level  PARTICIPATION LIMITATIONS: meal prep, cleaning, laundry, driving, shopping, community activity, occupation, and yard work  PERSONAL FACTORS: high co-pay   REHAB POTENTIAL: Good  CLINICAL DECISION MAKING: Stable/uncomplicated  EVALUATION COMPLEXITY: Low   GOALS: Goals reviewed with patient? Yes  SHORT TERM GOALS: Target date: 08/01/2023   Patient will increase passive knee flexion and 90 degrees Baseline: Goal status: MET 4/15  2.  Patient will wean off crutches and increase weightbearing as tolerated Baseline:  Goal status: IN PROGRESS 4/15  3.  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  Patient will return to kayaking without pain Baseline:  Goal status: INITIAL  3.  Patient will go up and down 8 steps with reciprocal gait pattern without pain Baseline:  Goal status: INITIAL    PLAN:  PT FREQUENCY: 2x/week  PT DURATION: 8 weeks  PLANNED INTERVENTIONS: 97110-Therapeutic exercises, 97530- Therapeutic activity, [id number]- Neuromuscular re-education, 97535- Self Care, 16109- Manual therapy,  [id number]- Gait training, [phone]- Aquatic Therapy, 97014- Electrical stimulation (unattended), 97035- Ultrasound, Patient/Family education, Stair training, Taping, Dry Needling, DME instructions, Cryotherapy, and Moist heat   PLAN FOR NEXT SESSION: Patient's visits may be limited by excessively high co-pay.  Based plan of care on how many visits we will be able to see her for.  Continue per meniscal repair protocol.    Joy Cruz, PTA 07/30/2023 2:42 PM

## 2023-08-01 ENCOUNTER — Ambulatory Visit (HOSPITAL_BASED_OUTPATIENT_CLINIC_OR_DEPARTMENT_OTHER): Admitting: Physical Therapy

## 2023-08-01 DIAGNOSIS — M25562 Pain in left knee: Secondary | ICD-10-CM

## 2023-08-01 DIAGNOSIS — R2689 Other abnormalities of gait and mobility: Secondary | ICD-10-CM

## 2023-08-01 DIAGNOSIS — M25662 Stiffness of left knee, not elsewhere classified: Secondary | ICD-10-CM

## 2023-08-01 NOTE — Therapy (Addendum)
 OUTPATIENT PHYSICAL THERAPY TREATMENT   Patient Name: Joy Cruz MRN: [id number] DOB:21-Jul-1975, 48 y.o., female Today's Date: 08/01/2023  END OF SESSION:  PT End of Session - 08/01/23 1440     Visit Number 6    Number of Visits 16    Date for PT Re-Evaluation 08/29/23    PT Start Time 1435    PT Stop Time 1515    PT Time Calculation (min) 40 min    Activity Tolerance Patient tolerated treatment well    Behavior During Therapy [facility] for tasks assessed/performed                 Past Medical History:  Diagnosis Date   Anemia    Diabetes mellitus without complication (HCC)    Past Surgical History:  Procedure Laterality Date   CESAREAN SECTION     CHONDROPLASTY Left 07/02/2023   Procedure: FEMORAL PATELLA CHONDROPLASTY;  Surgeon: Huel Cote, MD;  Location: [facility];  Service: Orthopedics;  Laterality: Left;   KNEE ARTHROSCOPY WITH MENISCAL REPAIR Left 07/02/2023   Procedure: LEFT KNEE ARTHROSCOPY WITH MENISCAL REPAIR;  Surgeon: Huel Cote, MD;  Location: [facility];  Service: Orthopedics;  Laterality: Left;   Patient Active Problem List   Diagnosis Date Noted   Tear of medial meniscus of left knee, current 07/02/2023   Establishing care with new doctor, encounter for 05/21/2023   Acute pain of left knee 05/21/2023   Diabetes mellitus without complication (HCC) 05/21/2023    PCP: Dorena Bodo FNP  REFERRING PROVIDER: Dr Cristela Felt MD   REFERRING DIAG: 1.  Left knee medial meniscal root repair with centralization 2.  Left knee medial femoral condyle abrasion arthroplasty  THERAPY DIAG:  No diagnosis found.  Rationale for Evaluation and Treatment: Rehabilitation  ONSET DATE: 07/02/2023 (meniscus repair surgery)  SUBJECTIVE:   Per eval - Patient is a 48 year old female status post left knee medial meniscal root repair with left knee medial femoral condyle abrasion arthroplasty on 06/04/2023.  The patient was stepping  off a truck in August 2024 when she twisted her knee.  She had continuous pain until her surgical repair.  At this time she is still having high levels of pain.  She reports her pain fluctuates between 7 and 9.  She feels significant stiffness.  SUBJECTIVE STATEMENT: 08/01/2023 Pt states she was very sore after last PT session. Felt she might have overstretched it.    PERTINENT HISTORY: DM2, C-section PAIN:  08/01/2023 6/10 medial/anterior knee    Per eval - Are you having pain? Yes: NPRS scale: 5/10 has been a consistant 9-10  Pain location: Medial knee Pain description: Aching Aggravating factors: standing and walking  Relieving factors: rest, ice and pain medication   PRECAUTIONS: None  RED FLAGS: None   WEIGHT BEARING RESTRICTIONS: Yes NWB 2 weeks   FALLS:  Has patient fallen in last 6 months? No  LIVING ENVIRONMENT: No steps into her house  OCCUPATION:   Drives a truck   Hobbies: Data processing manager    PLOF: Independent  PATIENT GOALS:  Get back to regular activity  NEXT MD VISIT:  Next week   OBJECTIVE:  Note: Objective measures were completed at Evaluation unless otherwise noted.  DIAGNOSTIC FINDINGS:    PATIENT SURVEYS:  LEFS 5/80  COGNITION: Overall cognitive status: Within functional limits for tasks assessed     SENSATION: Numbness around the surgical site   EDEMA:  Circumferential: Will measure next visit  POSTURE:  forward head  PALPATION: No unexpected tenderness to palpation   LOWER EXTREMITY ROM:  Passive ROM Right eval Left eval Left 07/12/23 Left 07/19/23 Left  4/15   Hip flexion       Hip extension       Hip abduction       Hip adduction       Hip internal rotation       Hip external rotation       Knee flexion  30 with physical therapist able to self assist with strap to 40 degrees AA: 46-48deg AA: 71 deg seated w/ strap  AA: 94deg supine with strap  Knee extension  -5   -4  Ankle dorsiflexion        Ankle plantarflexion       Ankle inversion       Ankle eversion        (Blank rows = not tested)  LOWER EXTREMITY MMT:  MMT Right eval Left eval  Hip flexion    Hip extension    Hip abduction    Hip adduction    Hip internal rotation    Hip external rotation    Knee flexion    Knee extension    Ankle dorsiflexion    Ankle plantarflexion    Ankle inversion    Ankle eversion     (Blank rows = not tested) No formal MMT 2nd to recent surgery                                                                                                                                 TREATMENT DATE:  [facility] Adult PT Treatment:                                                DATE: 08/01/23 Therapeutic Exercise: Heel slide with strap 2x10  Calf stretch with strap, knee extended x30" Prolonged knee ext stretch x30" Sidelying hip abduction 2x10 Seated hamstring curl 2x10 Manual Therapy: Patellar mobs, STM L quad Neuromuscular re-ed: Quad set w/ towel 3x10 Glute set heel press into towel with knee extended 3x10 Shuttle leg press 75# 2x10 Therapeutic Activity: Sit<>stand x10 Staggered stance fwd/bwd weight shift focusing on knee ext x10 L<>R weight shift focusing on knee ext x10     [facility] Adult PT Treatment:                                                DATE: 07/30/23 Therapeutic Exercise: PROM L knee Updated measurements Heel slide with strap x10 with PT MWM for knee flexion Quad set w/ towel 3x10 Sidelying hip abduction 2x10 Supine SLR with minA x10, no assist x10 Long  sit HSS  30sec x3  Manual Therapy: STM to L quad     [facility] Adult PT Treatment:                                                DATE: 07/26/23 Therapeutic Exercise: Heel slide with strap x10 with PT MWM for knee flexion Quad set w/ towel 3x10 SAQ with bolster 2x10 Supine hip abd 2x10 Supine knee extended glute set 2x10 Manual Therapy: Patellar mobilization IASTM L quad Gait: Weight shift R&L  Staggered  stance weight shift fwd/bwd  Readjusted crutch and worked on amb with single crutch to decrease push into pt's arm pit and decrease her N/T Self Care: Discussed icing, compression, brace management, protocol   [facility] Adult PT Treatment:                                                DATE: 07/19/23 Therapeutic Exercise: Quad set w/ towel 3x10 significant improvement in contraction  Heel slides x10 cues for comfortable ROM Ankle pumps x20 Attempting PT assisted SLR in brace, discontinued due to difficulty HEP education/discussion, increased time spent w/ education on importance of quad sets for knee stability  Self Care: Continued education on gradual progression per protocol, strategies to improve safety/comfort w/ mobility, and brace management   [facility] Adult PT Treatment:                                                DATE: 07/12/23 Therapeutic Exercise: Quad set x10 cues for setup and reduced compensations Ankle pumps x15 Heel slides w/ strap x8, seated HEP education/discussion  Gait Training Gait w/ axilary crutches and NWB precautions ; cues and assist to reduce fwd trunk flexion, improving posture, and maintaining appropriate LE mechanics to avoid WB  Self Care: Assist with readjusting brace w/ increased time spent to educate on appropriate alignment/adjustment ; education on AD use, maintaining NWB precautions until cleared by physician, appropriate activity modification and post op restrictions/protocol   Eval 07/04/23: Manual: Gentle passive range of motion into available ranges.  Assessment of range of motion.  Review of self range of motion.  There ex: Reviewed quad set.  Reviewed quad set with opposite leg for muscle firing. Self-care: Redressed patient's dressing.  Refit patient's brace.  Patient's brace was set low. Gait: See gait section   PATIENT EDUCATION:  Education details: HEP, symptom management, post op precautions and surgical protocol Person educated:  Patient Education method: Explanation, Demonstration, Tactile cues, Verbal cues, and Handouts Education comprehension: verbalized understanding, returned demonstration, verbal cues required, tactile cues required, and needs further education  HOME EXERCISE PROGRAM: Access Code: [email or web address] URL: [email or web address] Date: 07/26/2023 Prepared by: Vernon Prey Kamrie Kirstie Peri  Exercises - Supine Quad Set  - 3-4 x daily - 7 x weekly - 3 sets - 10 reps - Supine Heel Slide with Strap  - 3-4 x daily - 7 x weekly - 3 sets - 10 reps - Supine Knee Extension Strengthening  - 1 x daily - 7 x weekly - 3 sets - 10 reps - Supine Gluteal Sets  -  1 x daily - 7 x weekly - 3 sets - 10 reps - 4 Way Patellar Glide  - 1 x daily - 7 x weekly - 1 sets - 10 reps - Staggered Stance Forward Backward Weight Shift with Unilateral Counter Support  - 1 x daily - 7 x weekly - 2 sets - 10 reps  ASSESSMENT:  CLINICAL IMPRESSION: 08/01/2023 Improving quad activation but requires increased time to get muscle firing correctly. Worked on improving LE weight shift and knee extension in standing today. Improving knee ROM. Working on improving sit to stand using more knee flexion as well as decreasing knee valgus with sit to stands and using shuttle leg press.    Per eval - Patient is a 48 year old female status post left medial meniscal root repair and femoral condyle chondroplasty on 07/02/2023.  She presents with expected limitations in range of motion, function, and strength, she is nonweightbearing at this time.  Therapy reviewed gait training with the patient.  Therapy also adjusted the patient's brace to fit better.  She is limited by pain today.  Patient was given a limited HEP to begin working on base range of motion.  She a advised not to move range of motion past 90 degrees.  She is nowhere near 90 degrees at this time.  She would benefit from skilled therapy to return to work as a [employer] and also to return  to active lifestyle.  OBJECTIVE IMPAIRMENTS: Abnormal gait, decreased activity tolerance, decreased knowledge of condition, decreased knowledge of use of DME, decreased mobility, difficulty walking, decreased ROM, decreased strength, increased edema, and pain.   ACTIVITY LIMITATIONS: carrying, standing, squatting, sleeping, stairs, transfers, bed mobility, bathing, toileting, dressing, hygiene/grooming, and locomotion level  PARTICIPATION LIMITATIONS: meal prep, cleaning, laundry, driving, shopping, community activity, occupation, and yard work  PERSONAL FACTORS: high co-pay   REHAB POTENTIAL: Good  CLINICAL DECISION MAKING: Stable/uncomplicated  EVALUATION COMPLEXITY: Low   GOALS: Goals reviewed with patient? Yes  SHORT TERM GOALS: Target date: 08/01/2023   Patient will increase passive knee flexion and 90 degrees Baseline: Goal status: MET 4/15  2.  Patient will wean off crutches and increase weightbearing as tolerated Baseline:  Goal status: IN PROGRESS 4/15  3.  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  Patient will return to kayaking without pain Baseline:  Goal status: INITIAL  3.  Patient will go up and down 8 steps with reciprocal gait pattern without pain Baseline:  Goal status: INITIAL    PLAN:  PT FREQUENCY: 2x/week  PT DURATION: 8 weeks  PLANNED INTERVENTIONS: 97110-Therapeutic exercises, 97530- Therapeutic activity, [id number]- Neuromuscular re-education, 97535- Self Care, 16109- Manual therapy, [id number]- Gait training, [phone]- Aquatic Therapy, 97014- Electrical stimulation (unattended), 97035- Ultrasound, Patient/Family education, Stair training, Taping, Dry Needling, DME instructions, Cryotherapy, and Moist heat   PLAN FOR NEXT SESSION: Patient's visits may be limited by  excessively high co-pay.  Based plan of care on how many visits we will be able to see her for.  Continue per meniscal repair protocol. Amb without a/d as tolerated. Update HEP!!!   Sonnie Bias Kaydence Dell Ponto, PT 08/01/2023 2:41 PM

## 2023-08-02 ENCOUNTER — Encounter (HOSPITAL_BASED_OUTPATIENT_CLINIC_OR_DEPARTMENT_OTHER): Payer: 59 | Admitting: Physical Therapy

## 2023-08-06 ENCOUNTER — Encounter (HOSPITAL_BASED_OUTPATIENT_CLINIC_OR_DEPARTMENT_OTHER): Payer: Self-pay

## 2023-08-06 ENCOUNTER — Ambulatory Visit (HOSPITAL_BASED_OUTPATIENT_CLINIC_OR_DEPARTMENT_OTHER): Payer: 59

## 2023-08-06 DIAGNOSIS — M25562 Pain in left knee: Secondary | ICD-10-CM

## 2023-08-06 DIAGNOSIS — R2689 Other abnormalities of gait and mobility: Secondary | ICD-10-CM

## 2023-08-06 DIAGNOSIS — M25662 Stiffness of left knee, not elsewhere classified: Secondary | ICD-10-CM

## 2023-08-06 NOTE — Therapy (Signed)
 OUTPATIENT PHYSICAL THERAPY TREATMENT   Patient Name: Joy Cruz MRN: [id number] DOB:1976/04/04, 48 y.o., female Today's Date: 08/06/2023  END OF SESSION:        Past Medical History:  Diagnosis Date   Anemia    Diabetes mellitus without complication [facility])    Past Surgical History:  Procedure Laterality Date   CESAREAN SECTION     CHONDROPLASTY Left 07/02/2023   Procedure: FEMORAL PATELLA CHONDROPLASTY;  Surgeon: Wilhelmenia Harada, MD;  Location: [facility];  Service: Orthopedics;  Laterality: Left;   KNEE ARTHROSCOPY WITH MENISCAL REPAIR Left 07/02/2023   Procedure: LEFT KNEE ARTHROSCOPY WITH MENISCAL REPAIR;  Surgeon: Wilhelmenia Harada, MD;  Location:  SURGERY CENTER;  Service: Orthopedics;  Laterality: Left;   Patient Active Problem List   Diagnosis Date Noted   Tear of medial meniscus of left knee, current 07/02/2023   Establishing care with new doctor, encounter for 05/21/2023   Acute pain of left knee 05/21/2023   Diabetes mellitus without complication (HCC) 05/21/2023    PCP: Buddie Carina FNP  REFERRING PROVIDER: Dr Phoebe Breed MD   REFERRING DIAG: 1.  Left knee medial meniscal root repair with centralization 2.  Left knee medial femoral condyle abrasion arthroplasty  THERAPY DIAG:  Acute pain of left knee  Stiffness of left knee, not elsewhere classified  Other abnormalities of gait and mobility  Rationale for Evaluation and Treatment: Rehabilitation  ONSET DATE: 07/02/2023 (meniscus repair surgery)  SUBJECTIVE:   Per eval - Patient is a 48 year old female status post left knee medial meniscal root repair with left knee medial femoral condyle abrasion arthroplasty on 07/02/2023.  The patient was stepping off a truck in August 2024 when she twisted her knee.  She had continuous pain until her surgical repair.  At this time she is still having high levels of pain.  She reports her pain fluctuates between 7 and 9.  She feels  significant stiffness.  SUBJECTIVE STATEMENT: 08/06/2023 Pt reports 7/10 pain level at entry. Reports ongoing swelling in medial knee. Has been taking 800mg  ibuprofen  and extra strength tylenol . Stopped taking oxycodone  due to side effects.    PERTINENT HISTORY: DM2, C-section PAIN:  08/06/2023 7/10 medial/anterior knee    Per eval - Are you having pain? Yes: NPRS scale: 5/10 has been a consistant 9-10  Pain location: Medial knee Pain description: Aching Aggravating factors: standing and walking  Relieving factors: rest, ice and pain medication   PRECAUTIONS: None  RED FLAGS: None   WEIGHT BEARING RESTRICTIONS: Yes NWB 2 weeks   FALLS:  Has patient fallen in last 6 months? No  LIVING ENVIRONMENT: No steps into her house  OCCUPATION:   Drives a truck   Hobbies: Data processing manager    PLOF: Independent  PATIENT GOALS:  Get back to regular activity  NEXT MD VISIT:  Next week   OBJECTIVE:  Note: Objective measures were completed at Evaluation unless otherwise noted.  DIAGNOSTIC FINDINGS:    PATIENT SURVEYS:  LEFS 5/80  COGNITION: Overall cognitive status: Within functional limits for tasks assessed     SENSATION: Numbness around the surgical site   EDEMA:  Circumferential: Will measure next visit  POSTURE: forward head  PALPATION: No unexpected tenderness to palpation   LOWER EXTREMITY ROM:  Passive ROM Right eval Left eval Left 07/12/23 Left 07/19/23 Left  4/15   Hip flexion       Hip extension       Hip abduction  Hip adduction       Hip internal rotation       Hip external rotation       Knee flexion  30 with physical therapist able to self assist with strap to 40 degrees AA: 46-48deg AA: 71 deg seated w/ strap  AA: 94deg supine with strap  Knee extension  -5   -4  Ankle dorsiflexion       Ankle plantarflexion       Ankle inversion       Ankle eversion        (Blank rows = not tested)  LOWER EXTREMITY  MMT:  MMT Right eval Left eval  Hip flexion    Hip extension    Hip abduction    Hip adduction    Hip internal rotation    Hip external rotation    Knee flexion    Knee extension    Ankle dorsiflexion    Ankle plantarflexion    Ankle inversion    Ankle eversion     (Blank rows = not tested) No formal MMT 2nd to recent surgery                                                                                                                                 TREATMENT DATE:   [facility] Adult PT Treatment:                                                DATE: 08/06/23 Therapeutic Exercise: Heel slide with strap 2x10  PROM L knee  Manual Therapy: Patellar mobs, STM L quad, HS  Therapeutic Activity: Sit<>stand x10 Gait with single crutch in hall Gait with SPC in hall Partial squats at rail with brace donned x8     [facility] Adult PT Treatment:                                                DATE: 08/01/23 Therapeutic Exercise: Heel slide with strap 2x10  Calf stretch with strap, knee extended x30" Prolonged knee ext stretch x30" Sidelying hip abduction 2x10 Seated hamstring curl 2x10 Manual Therapy: Patellar mobs, STM L quad Neuromuscular re-ed: Quad set w/ towel 3x10 Glute set heel press into towel with knee extended 3x10 Shuttle leg press 75# 2x10 Therapeutic Activity: Sit<>stand x10 Staggered stance fwd/bwd weight shift focusing on knee ext x10 L<>R weight shift focusing on knee ext x10     [facility] Adult PT Treatment:  DATE: 07/30/23 Therapeutic Exercise: PROM L knee Updated measurements Heel slide with strap x10 with PT MWM for knee flexion Quad set w/ towel 3x10 Sidelying hip abduction 2x10 Supine SLR with minA x10, no assist x10 Long sit HSS  30sec x3  Manual Therapy: STM to L quad        PATIENT EDUCATION:  Education details: HEP, symptom management, post op precautions and surgical protocol Person educated:  Patient Education method: Explanation, Demonstration, Tactile cues, Verbal cues, and Handouts Education comprehension: verbalized understanding, returned demonstration, verbal cues required, tactile cues required, and needs further education  HOME EXERCISE PROGRAM: Access Code: [email or web address] URL: [email or web address] Date: 07/26/2023 Prepared by: Gellen Genni Erman Hayward  Exercises - Supine Quad Set  - 3-4 x daily - 7 x weekly - 3 sets - 10 reps - Supine Heel Slide with Strap  - 3-4 x daily - 7 x weekly - 3 sets - 10 reps - Supine Knee Extension Strengthening  - 1 x daily - 7 x weekly - 3 sets - 10 reps - Supine Gluteal Sets  - 1 x daily - 7 x weekly - 3 sets - 10 reps - 4 Way Patellar Glide  - 1 x daily - 7 x weekly - 1 sets - 10 reps - Staggered Stance Forward Backward Weight Shift with Unilateral Counter Support  - 1 x daily - 7 x weekly - 2 sets - 10 reps  ASSESSMENT:  CLINICAL IMPRESSION: 08/06/2023 Pt remains restricted into endrange knee flexion as well as extension.  Very tender upon palpation to distal medial hamstrings.  Instructed on how to use foam roller at home to target this area.  Reviewed avoiding full removal under knee, prolonged positions to decrease hamstring tightness.  Patient continues to ambulate with altered gait pattern due to lack of knee extension.  Did trial [facility] today versus crutch as she reports correction has been bothersome in her axilla area.  She did demonstrate mild increased unsteadiness with this, though overall she did well.  Advised she can practice this at home though use crutch for longer distances outside of home.  Trialed partial squats at rail which she did have some discomfort with so discontinued use.  Instructed her in exercise modification at home to avoid painful limits.  Will continue to progress range of motion and strength as tolerated in clinic.   Per eval - Patient is a 48 year old female status post left medial meniscal root  repair and femoral condyle chondroplasty on 07/02/2023.  She presents with expected limitations in range of motion, function, and strength, she is nonweightbearing at this time.  Therapy reviewed gait training with the patient.  Therapy also adjusted the patient's brace to fit better.  She is limited by pain today.  Patient was given a limited HEP to begin working on base range of motion.  She a advised not to move range of motion past 90 degrees.  She is nowhere near 90 degrees at this time.  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  OBJECTIVE IMPAIRMENTS: Abnormal gait, decreased activity tolerance, decreased knowledge of condition, decreased knowledge of use of DME, decreased mobility, difficulty walking, decreased ROM, decreased strength, increased edema, and pain.   ACTIVITY LIMITATIONS: carrying, standing, squatting, sleeping, stairs, transfers, bed mobility, bathing, toileting, dressing, hygiene/grooming, and locomotion level  PARTICIPATION LIMITATIONS: meal prep, cleaning, laundry, driving, shopping, community activity, occupation, and yard work  PERSONAL FACTORS: high  co-pay   REHAB POTENTIAL: Good  CLINICAL DECISION MAKING: Stable/uncomplicated  EVALUATION COMPLEXITY: Low   GOALS: Goals reviewed with patient? Yes  SHORT TERM GOALS: Target date: 08/01/2023   Patient will increase passive knee flexion and 90 degrees Baseline: Goal status: MET 4/15  2.  Patient will wean off crutches and increase weightbearing as tolerated Baseline:  Goal status: IN PROGRESS 4/15  3.  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  Patient will return to kayaking without pain Baseline:  Goal status: INITIAL  3.  Patient will go up and down 8  steps with reciprocal gait pattern without pain Baseline:  Goal status: INITIAL    PLAN:  PT FREQUENCY: 2x/week  PT DURATION: 8 weeks  PLANNED INTERVENTIONS: 97110-Therapeutic exercises, 97530- Therapeutic activity, [id number]- Neuromuscular re-education, 97535- Self Care, 16109- Manual therapy, [id number]- Gait training, [phone]- Aquatic Therapy, 97014- Electrical stimulation (unattended), 97035- Ultrasound, Patient/Family education, Stair training, Taping, Dry Needling, DME instructions, Cryotherapy, and Moist heat   PLAN FOR NEXT SESSION: Patient's visits may be limited by excessively high co-pay.  Based plan of care on how many visits we will be able to see her for.  Continue per meniscal repair protocol. Amb without a/d as tolerated. Update HEP!!!   Judie Noun, PTA 08/06/2023 3:27 PM

## 2023-08-09 ENCOUNTER — Telehealth (HOSPITAL_BASED_OUTPATIENT_CLINIC_OR_DEPARTMENT_OTHER): Payer: Self-pay | Admitting: Orthopaedic Surgery

## 2023-08-09 ENCOUNTER — Other Ambulatory Visit (HOSPITAL_BASED_OUTPATIENT_CLINIC_OR_DEPARTMENT_OTHER): Payer: Self-pay | Admitting: Orthopaedic Surgery

## 2023-08-09 ENCOUNTER — Encounter (HOSPITAL_BASED_OUTPATIENT_CLINIC_OR_DEPARTMENT_OTHER): Payer: Self-pay

## 2023-08-09 ENCOUNTER — Ambulatory Visit (HOSPITAL_BASED_OUTPATIENT_CLINIC_OR_DEPARTMENT_OTHER): Payer: 59 | Admitting: Physical Therapy

## 2023-08-09 ENCOUNTER — Other Ambulatory Visit (HOSPITAL_BASED_OUTPATIENT_CLINIC_OR_DEPARTMENT_OTHER): Payer: Self-pay

## 2023-08-09 DIAGNOSIS — M25562 Pain in left knee: Secondary | ICD-10-CM

## 2023-08-09 DIAGNOSIS — R2689 Other abnormalities of gait and mobility: Secondary | ICD-10-CM

## 2023-08-09 DIAGNOSIS — M25662 Stiffness of left knee, not elsewhere classified: Secondary | ICD-10-CM

## 2023-08-09 MED ORDER — IBUPROFEN 800 MG PO TABS
800.0000 mg | ORAL_TABLET | Freq: Three times a day (TID) | ORAL | 0 refills | Status: DC
  Filled 2023-08-09: qty 30, 10d supply, fill #0

## 2023-08-09 MED ORDER — IBUPROFEN 800 MG PO TABS: 800.0000 mg | ORAL_TABLET | Freq: Three times a day (TID) | ORAL | 1 refills | Status: AC

## 2023-08-09 NOTE — Therapy (Signed)
 OUTPATIENT PHYSICAL THERAPY TREATMENT   Patient Name: Joy Cruz MRN: [id number] DOB:[birth year], 48 y.o., female Today's Date: 08/09/2023  END OF SESSION:  PT End of Session - 08/09/23 1346     Visit Number 7    Number of Visits 16    Date for PT Re-Evaluation 08/29/23    PT Start Time 1346    PT Stop Time 1430    PT Time Calculation (min) 44 min    Activity Tolerance Patient tolerated treatment well    Behavior During Therapy [facility] for tasks assessed/performed              Past Medical History:  Diagnosis Date   Anemia    Diabetes mellitus without complication (HCC)    Past Surgical History:  Procedure Laterality Date   CESAREAN SECTION     CHONDROPLASTY Left 07/02/2023   Procedure: FEMORAL PATELLA CHONDROPLASTY;  Surgeon: Wilhelmenia Harada, MD;  Location: [facility];  Service: Orthopedics;  Laterality: Left;   KNEE ARTHROSCOPY WITH MENISCAL REPAIR Left 07/02/2023   Procedure: LEFT KNEE ARTHROSCOPY WITH MENISCAL REPAIR;  Surgeon: Wilhelmenia Harada, MD;  Location: [facility];  Service: Orthopedics;  Laterality: Left;   Patient Active Problem List   Diagnosis Date Noted   Tear of medial meniscus of left knee, current 07/02/2023   Establishing care with new doctor, encounter for 05/21/2023   Acute pain of left knee 05/21/2023   Diabetes mellitus without complication (HCC) 05/21/2023    PCP: Buddie Carina FNP  REFERRING PROVIDER: Dr Phoebe Breed MD   REFERRING DIAG: 1.  Left knee medial meniscal root repair with centralization 2.  Left knee medial femoral condyle abrasion arthroplasty  THERAPY DIAG:  Acute pain of left knee  Stiffness of left knee, not elsewhere classified  Other abnormalities of gait and mobility  Rationale for Evaluation and Treatment: Rehabilitation  ONSET DATE: 07/02/2023 (meniscus repair surgery)  SUBJECTIVE:   Per eval - Patient is a 49 year old female status post left knee medial meniscal root repair with  left knee medial femoral condyle abrasion arthroplasty on 07/02/2023.  The patient was stepping off a truck in August 2024 when she twisted her knee.  She had continuous pain until her surgical repair.  At this time she is still having high levels of pain.  She reports her pain fluctuates between 7 and 9.  She feels significant stiffness.  SUBJECTIVE STATEMENT: 08/09/2023 Pt states she walked a lot already today so her knee is hurting more.    PERTINENT HISTORY: DM2, C-section PAIN:  08/09/2023 7/10 medial/anterior knee    Per eval - Are you having pain? Yes: NPRS scale: 5/10 has been a consistant 9-10  Pain location: Medial knee Pain description: Aching Aggravating factors: standing and walking  Relieving factors: rest, ice and pain medication   PRECAUTIONS: None  RED FLAGS: None   WEIGHT BEARING RESTRICTIONS: Yes NWB 2 weeks   FALLS:  Has patient fallen in last 6 months? No  LIVING ENVIRONMENT: No steps into her house  OCCUPATION:   Drives a truck   Hobbies: Data processing manager    PLOF: Independent  PATIENT GOALS:  Get back to regular activity  NEXT MD VISIT:  Next week   OBJECTIVE:  Note: Objective measures were completed at Evaluation unless otherwise noted.  DIAGNOSTIC FINDINGS:    PATIENT SURVEYS:  LEFS 5/80  COGNITION: Overall cognitive status: Within functional limits for tasks assessed     SENSATION: Numbness around the surgical  site   EDEMA:  Circumferential: Will measure next visit  POSTURE: forward head  PALPATION: No unexpected tenderness to palpation   LOWER EXTREMITY ROM:  Passive ROM Right eval Left eval Left 07/12/23 Left 07/19/23 Left  4/15   Hip flexion       Hip extension       Hip abduction       Hip adduction       Hip internal rotation       Hip external rotation       Knee flexion  30 with physical therapist able to self assist with strap to 40 degrees AA: 46-48deg AA: 71 deg seated w/ strap  AA:  94deg supine with strap  Knee extension  -5   -4  Ankle dorsiflexion       Ankle plantarflexion       Ankle inversion       Ankle eversion        (Blank rows = not tested)  LOWER EXTREMITY MMT:  MMT Right eval Left eval  Hip flexion    Hip extension    Hip abduction    Hip adduction    Hip internal rotation    Hip external rotation    Knee flexion    Knee extension    Ankle dorsiflexion    Ankle plantarflexion    Ankle inversion    Ankle eversion     (Blank rows = not tested) No formal MMT 2nd to recent surgery                                                                                                                                 TREATMENT DATE:  [facility] Adult PT Treatment:                                                DATE: 08/09/23 Therapeutic Exercise: Recumbent bike half revolutions x5 min total Prone knee hang 2x1 min with self over pressure Prone quad stretch with strap 2x30" Neuromuscular re-ed: Prone quad set 2x10 Prone knee flex/ext with strap assist 2x10 cues for terminal knee ext Therapeutic Activity: Standing hip abd red TB around thighs 2x10 Attempted standing on L LE with full weight but unable to tolerate without feeling her knee is turning in Self Care: HEP updates   [facility] Adult PT Treatment:                                                DATE: 08/06/23 Therapeutic Exercise: Heel slide with strap 2x10  PROM L knee  Manual Therapy: Patellar mobs, STM L quad, HS  Therapeutic Activity: Sit<>stand x10 Gait with single crutch in  hall Gait with SPC in hall Partial squats at rail with brace donned x8     [facility] Adult PT Treatment:                                                DATE: 08/01/23 Therapeutic Exercise: Heel slide with strap 2x10  Calf stretch with strap, knee extended x30" Prolonged knee ext stretch x30" Sidelying hip abduction 2x10 Seated hamstring curl 2x10 Manual Therapy: Patellar mobs, STM L quad Neuromuscular re-ed: Quad  set w/ towel 3x10 Glute set heel press into towel with knee extended 3x10 Shuttle leg press 75# 2x10 Therapeutic Activity: Sit<>stand x10 Staggered stance fwd/bwd weight shift focusing on knee ext x10 L<>R weight shift focusing on knee ext x10     [facility] Adult PT Treatment:                                                DATE: 07/30/23 Therapeutic Exercise: PROM L knee Updated measurements Heel slide with strap x10 with PT MWM for knee flexion Quad set w/ towel 3x10 Sidelying hip abduction 2x10 Supine SLR with minA x10, no assist x10 Long sit HSS  30sec x3  Manual Therapy: STM to L quad        PATIENT EDUCATION:  Education details: HEP, symptom management, post op precautions and surgical protocol Person educated: Patient Education method: Explanation, Demonstration, Tactile cues, Verbal cues, and Handouts Education comprehension: verbalized understanding, returned demonstration, verbal cues required, tactile cues required, and needs further education  HOME EXERCISE PROGRAM: Access Code: [email or web address] URL: [email or web address] Date: 07/26/2023 Prepared by: Lamyia Cdebaca Reathel Erman Hayward  Exercises - Supine Quad Set  - 3-4 x daily - 7 x weekly - 3 sets - 10 reps - Supine Heel Slide with Strap  - 3-4 x daily - 7 x weekly - 3 sets - 10 reps - Supine Knee Extension Strengthening  - 1 x daily - 7 x weekly - 3 sets - 10 reps - Supine Gluteal Sets  - 1 x daily - 7 x weekly - 3 sets - 10 reps - 4 Way Patellar Glide  - 1 x daily - 7 x weekly - 1 sets - 10 reps - Staggered Stance Forward Backward Weight Shift with Unilateral Counter Support  - 1 x daily - 7 x weekly - 2 sets - 10 reps  ASSESSMENT:  CLINICAL IMPRESSION: 08/09/2023 Treatment focused on progressing HEP. Able to tolerate red TB for hip abd strengthening but unable to tolerate standing on L LE for R hip abd without feeling too much in her knee. Increasing knee ROM and terminal quad strength.    Per eval -  Patient is a 48 year old female status post left medial meniscal root repair and femoral condyle chondroplasty on 07/02/2023.  She presents with expected limitations in range of motion, function, and strength, she is nonweightbearing at this time.  Therapy reviewed gait training with the patient.  Therapy also adjusted the patient's brace to fit better.  She is limited by pain today.  Patient was given a limited HEP to begin working on base range of motion.  She a advised not to move range of motion past 90 degrees.  She is nowhere near 90 degrees  at this time.  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  OBJECTIVE IMPAIRMENTS: Abnormal gait, decreased activity tolerance, decreased knowledge of condition, decreased knowledge of use of DME, decreased mobility, difficulty walking, decreased ROM, decreased strength, increased edema, and pain.   ACTIVITY LIMITATIONS: carrying, standing, squatting, sleeping, stairs, transfers, bed mobility, bathing, toileting, dressing, hygiene/grooming, and locomotion level  PARTICIPATION LIMITATIONS: meal prep, cleaning, laundry, driving, shopping, community activity, occupation, and yard work  PERSONAL FACTORS: high co-pay   REHAB POTENTIAL: Good  CLINICAL DECISION MAKING: Stable/uncomplicated  EVALUATION COMPLEXITY: Low   GOALS: Goals reviewed with patient? Yes  SHORT TERM GOALS: Target date: 08/01/2023   Patient will increase passive knee flexion and 90 degrees Baseline: Goal status: MET 4/15  2.  Patient will wean off crutches and increase weightbearing as tolerated Baseline:  Goal status: IN PROGRESS 4/15  3.  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  Patient will return to kayaking without  pain Baseline:  Goal status: INITIAL  3.  Patient will go up and down 8 steps with reciprocal gait pattern without pain Baseline:  Goal status: INITIAL    PLAN:  PT FREQUENCY: 2x/week  PT DURATION: 8 weeks  PLANNED INTERVENTIONS: 97110-Therapeutic exercises, 97530- Therapeutic activity, [id number]- Neuromuscular re-education, 97535- Self Care, [id number]- Manual therapy, [id number]- Gait training, [id number]- Aquatic Therapy, 97014- Electrical stimulation (unattended), 97035- Ultrasound, Patient/Family education, Stair training, Taping, Dry Needling, DME instructions, Cryotherapy, and Moist heat   PLAN FOR NEXT SESSION: Patient's visits may be limited by excessively high co-pay.  Based plan of care on how many visits we will be able to see her for.  Continue per meniscal repair protocol. Amb without a/d as tolerated.    Angad Nabers Nadya Ma L Quron Ruddy, PT 08/09/2023 1:47 PM

## 2023-08-09 NOTE — Telephone Encounter (Signed)
 Patient would like to Ibuprofen  sent to pharmacy down stairs

## 2023-08-12 ENCOUNTER — Encounter (HOSPITAL_BASED_OUTPATIENT_CLINIC_OR_DEPARTMENT_OTHER): Payer: Self-pay

## 2023-08-12 ENCOUNTER — Ambulatory Visit (HOSPITAL_BASED_OUTPATIENT_CLINIC_OR_DEPARTMENT_OTHER)

## 2023-08-12 DIAGNOSIS — R2689 Other abnormalities of gait and mobility: Secondary | ICD-10-CM

## 2023-08-12 DIAGNOSIS — M25562 Pain in left knee: Secondary | ICD-10-CM | POA: Diagnosis not present

## 2023-08-12 DIAGNOSIS — M25662 Stiffness of left knee, not elsewhere classified: Secondary | ICD-10-CM

## 2023-08-12 NOTE — Therapy (Signed)
 OUTPATIENT PHYSICAL THERAPY TREATMENT   Patient Name: Joy Cruz MRN: [id number] DOB:[birth year], 48 y.o., female Today's Date: 08/12/2023  END OF SESSION:  PT End of Session - 08/12/23 1352     Visit Number 8    Number of Visits 16    Date for PT Re-Evaluation 08/29/23    PT Start Time 1348    PT Stop Time 1430    PT Time Calculation (min) 42 min    Activity Tolerance Patient tolerated treatment well    Behavior During Therapy [facility] for tasks assessed/performed               Past Medical History:  Diagnosis Date   Anemia    Diabetes mellitus without complication (HCC)    Past Surgical History:  Procedure Laterality Date   CESAREAN SECTION     CHONDROPLASTY Left 07/02/2023   Procedure: FEMORAL PATELLA CHONDROPLASTY;  Surgeon: Wilhelmenia Harada, MD;  Location: [facility];  Service: Orthopedics;  Laterality: Left;   KNEE ARTHROSCOPY WITH MENISCAL REPAIR Left 07/02/2023   Procedure: LEFT KNEE ARTHROSCOPY WITH MENISCAL REPAIR;  Surgeon: Wilhelmenia Harada, MD;  Location: [facility];  Service: Orthopedics;  Laterality: Left;   Patient Active Problem List   Diagnosis Date Noted   Tear of medial meniscus of left knee, current 07/02/2023   Establishing care with new doctor, encounter for 05/21/2023   Acute pain of left knee 05/21/2023   Diabetes mellitus without complication (HCC) 05/21/2023    PCP: Buddie Carina FNP  REFERRING PROVIDER: Dr Phoebe Breed MD   REFERRING DIAG: 1.  Left knee medial meniscal root repair with centralization 2.  Left knee medial femoral condyle abrasion arthroplasty  THERAPY DIAG:  Acute pain of left knee  Stiffness of left knee, not elsewhere classified  Other abnormalities of gait and mobility  Rationale for Evaluation and Treatment: Rehabilitation  ONSET DATE: 07/02/2023 (meniscus repair surgery)  SUBJECTIVE:   Per eval - Patient is a 48 year old female status post left knee medial meniscal root repair with  left knee medial femoral condyle abrasion arthroplasty on 07/02/2023.  The patient was stepping off a truck in August 2024 when she twisted her knee.  She had continuous pain until her surgical repair.  At this time she is still having high levels of pain.  She reports her pain fluctuates between 7 and 9.  She feels significant stiffness.  SUBJECTIVE STATEMENT: 08/12/2023 Pt states she walked a lot already today so her knee is hurting more.    PERTINENT HISTORY: DM2, C-section PAIN:  08/12/2023 7/10 medial/anterior knee    Per eval - Are you having pain? Yes: NPRS scale: 5/10 has been a consistant 9-10  Pain location: Medial knee Pain description: Aching Aggravating factors: standing and walking  Relieving factors: rest, ice and pain medication   PRECAUTIONS: None  RED FLAGS: None   WEIGHT BEARING RESTRICTIONS: Yes NWB 2 weeks   FALLS:  Has patient fallen in last 6 months? No  LIVING ENVIRONMENT: No steps into her house  OCCUPATION:   Drives a truck   Hobbies: Data processing manager    PLOF: Independent  PATIENT GOALS:  Get back to regular activity  NEXT MD VISIT:  Next week   OBJECTIVE:  Note: Objective measures were completed at Evaluation unless otherwise noted.  DIAGNOSTIC FINDINGS:    PATIENT SURVEYS:  LEFS 5/80  COGNITION: Overall cognitive status: Within functional limits for tasks assessed     SENSATION: Numbness around the  surgical site   EDEMA:  Circumferential: Will measure next visit  POSTURE: forward head  PALPATION: No unexpected tenderness to palpation   LOWER EXTREMITY ROM:  Passive ROM Right eval Left eval Left 07/12/23 Left 07/19/23 Left  4/15   Hip flexion       Hip extension       Hip abduction       Hip adduction       Hip internal rotation       Hip external rotation       Knee flexion  30 with physical therapist able to self assist with strap to 40 degrees AA: 46-48deg AA: 71 deg seated w/ strap  AA:  94deg supine with strap  Knee extension  -5   -4  Ankle dorsiflexion       Ankle plantarflexion       Ankle inversion       Ankle eversion        (Blank rows = not tested)  LOWER EXTREMITY MMT:  MMT Right eval Left eval  Hip flexion    Hip extension    Hip abduction    Hip adduction    Hip internal rotation    Hip external rotation    Knee flexion    Knee extension    Ankle dorsiflexion    Ankle plantarflexion    Ankle inversion    Ankle eversion     (Blank rows = not tested) No formal MMT 2nd to recent surgery                                                                                                                                 TREATMENT DATE:   [facility] Adult PT Treatment:                                                DATE: 08/12/23 Therapeutic Exercise: Recumbent bike half revolutions x5 min total Prone knee hang 2x1 min with self over pressure Prone quad stretch with strap 2x30" Prone quad set 2x10 Standing HR/TR   Therapeutic Activity: Gait in hall with [facility] Weight shifts at railing   [facility] Adult PT Treatment:                                                DATE: 08/09/23 Therapeutic Exercise: Recumbent bike half revolutions x5 min total Prone knee hang 2x1 min with self over pressure Prone quad stretch with strap 2x30" Neuromuscular re-ed: Prone quad set 2x10 Prone knee flex/ext with strap assist 2x10 cues for terminal knee ext Therapeutic Activity: Standing hip abd red TB around thighs 2x10 Attempted standing on L LE with full weight  but unable to tolerate without feeling her knee is turning in Self Care: HEP updates   [facility] Adult PT Treatment:                                                DATE: 08/06/23 Therapeutic Exercise: Heel slide with strap 2x10  PROM L knee  Manual Therapy: Patellar mobs, STM L quad, HS  Therapeutic Activity: Sit<>stand x10 Gait with single crutch in hall Gait with SPC in hall Partial squats at rail with brace  donned x8     [facility] Adult PT Treatment:                                                DATE: 08/01/23 Therapeutic Exercise: Heel slide with strap 2x10  Calf stretch with strap, knee extended x30" Prolonged knee ext stretch x30" Sidelying hip abduction 2x10 Seated hamstring curl 2x10 Manual Therapy: Patellar mobs, STM L quad Neuromuscular re-ed: Quad set w/ towel 3x10 Glute set heel press into towel with knee extended 3x10 Shuttle leg press 75# 2x10 Therapeutic Activity: Sit<>stand x10 Staggered stance fwd/bwd weight shift focusing on knee ext x10 L<>R weight shift focusing on knee ext x10     [facility] Adult PT Treatment:                                                DATE: 07/30/23 Therapeutic Exercise: PROM L knee Updated measurements Heel slide with strap x10 with PT MWM for knee flexion Quad set w/ towel 3x10 Sidelying hip abduction 2x10 Supine SLR with minA x10, no assist x10 Long sit HSS  30sec x3  Manual Therapy: STM to L quad        PATIENT EDUCATION:  Education details: HEP, symptom management, post op precautions and surgical protocol Person educated: Patient Education method: Explanation, Demonstration, Tactile cues, Verbal cues, and Handouts Education comprehension: verbalized understanding, returned demonstration, verbal cues required, tactile cues required, and needs further education  HOME EXERCISE PROGRAM: Access Code: [email or web address] URL: [email or web address] Date: 07/26/2023 Prepared by: Gellen Miho Erman Hayward  Exercises - Supine Quad Set  - 3-4 x daily - 7 x weekly - 3 sets - 10 reps - Supine Heel Slide with Strap  - 3-4 x daily - 7 x weekly - 3 sets - 10 reps - Supine Knee Extension Strengthening  - 1 x daily - 7 x weekly - 3 sets - 10 reps - Supine Gluteal Sets  - 1 x daily - 7 x weekly - 3 sets - 10 reps - 4 Way Patellar Glide  - 1 x daily - 7 x weekly - 1 sets - 10 reps - Staggered Stance Forward Backward Weight Shift with  Unilateral Counter Support  - 1 x daily - 7 x weekly - 2 sets - 10 reps  ASSESSMENT:  CLINICAL IMPRESSION: 08/12/2023  Instructed pt in self scar massage to address feelings of tightness over incisions. Continued to work on improving L knee ROM and strength. Remains challenged by WB through L LE. Able to ambulate with with [facility] ~62ft before describing pain in L  knee, especially with heel contact. Pt also described feeling "shaky" in L thigh with ambulation. Continued to work on weight shifting and tolerance for placing weight through LLE. Pt will continue using single crutch until this improves.    Per eval - Patient is a 48 year old female status post left medial meniscal root repair and femoral condyle chondroplasty on 07/02/2023.  She presents with expected limitations in range of motion, function, and strength, she is nonweightbearing at this time.  Therapy reviewed gait training with the patient.  Therapy also adjusted the patient's brace to fit better.  She is limited by pain today.  Patient was given a limited HEP to begin working on base range of motion.  She a advised not to move range of motion past 90 degrees.  She is nowhere near 90 degrees at this time.  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  OBJECTIVE IMPAIRMENTS: Abnormal gait, decreased activity tolerance, decreased knowledge of condition, decreased knowledge of use of DME, decreased mobility, difficulty walking, decreased ROM, decreased strength, increased edema, and pain.   ACTIVITY LIMITATIONS: carrying, standing, squatting, sleeping, stairs, transfers, bed mobility, bathing, toileting, dressing, hygiene/grooming, and locomotion level  PARTICIPATION LIMITATIONS: meal prep, cleaning, laundry, driving, shopping, community activity, occupation, and yard work  PERSONAL FACTORS: high co-pay   REHAB POTENTIAL: Good  CLINICAL DECISION MAKING:  Stable/uncomplicated  EVALUATION COMPLEXITY: Low   GOALS: Goals reviewed with patient? Yes  SHORT TERM GOALS: Target date: 08/01/2023   Patient will increase passive knee flexion and 90 degrees Baseline: Goal status: MET 4/15  2.  Patient will wean off crutches and increase weightbearing as tolerated Baseline:  Goal status: IN PROGRESS 4/15  3.  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  Patient will return to kayaking without pain Baseline:  Goal status: INITIAL  3.  Patient will go up and down 8 steps with reciprocal gait pattern without pain Baseline:  Goal status: INITIAL    PLAN:  PT FREQUENCY: 2x/week  PT DURATION: 8 weeks  PLANNED INTERVENTIONS: 97110-Therapeutic exercises, 97530- Therapeutic activity, [id number]- Neuromuscular re-education, 97535- Self Care, [id number]- Manual therapy, [id number]- Gait training, [phone]- Aquatic Therapy, [id number]- Electrical stimulation (unattended), 97035- Ultrasound, Patient/Family education, Stair training, Taping, Dry Needling, DME instructions, Cryotherapy, and Moist heat   PLAN FOR NEXT SESSION: Patient's visits may be limited by excessively high co-pay.  Based plan of care on how many visits we will be able to see her for.  Continue per meniscal repair protocol. Amb without a/d as tolerated.    Fronie Jewett Alveria Mcglaughlin, PTA 08/12/2023 3:49 PM

## 2023-08-14 ENCOUNTER — Ambulatory Visit (HOSPITAL_BASED_OUTPATIENT_CLINIC_OR_DEPARTMENT_OTHER): Admitting: Orthopaedic Surgery

## 2023-08-14 DIAGNOSIS — S83242A Other tear of medial meniscus, current injury, left knee, initial encounter: Secondary | ICD-10-CM

## 2023-08-14 NOTE — Progress Notes (Signed)
 Post Operative Evaluation    Procedure/Date of Surgery: Left knee medial meniscal root repair 3/18  Interval History:    Resents today 6 weeks status post the above procedure.  She is continuing to improve with weightbearing.  She is still having somewhat of a difficult time achieving full extension.  Overall she is doing well   PMH/PSH/Family History/Social History/Meds/Allergies:    Past Medical History:  Diagnosis Date   Anemia    Diabetes mellitus without complication [facility])    Past Surgical History:  Procedure Laterality Date   CESAREAN SECTION     CHONDROPLASTY Left 07/02/2023   Procedure: FEMORAL PATELLA CHONDROPLASTY;  Surgeon: Wilhelmenia Harada, MD;  Location: [facility];  Service: Orthopedics;  Laterality: Left;   KNEE ARTHROSCOPY WITH MENISCAL REPAIR Left 07/02/2023   Procedure: LEFT KNEE ARTHROSCOPY WITH MENISCAL REPAIR;  Surgeon: Wilhelmenia Harada, MD;  Location: [facility];  Service: Orthopedics;  Laterality: Left;   Social History   Socioeconomic History   Marital status: Single    Spouse name: Not on file   Number of children: 3   Years of education: Not on file   Highest education level: Not on file  Occupational History   Not on file  Tobacco Use   Smoking status: Some Days    Types: Pipe    Passive exposure: Past   Smokeless tobacco: Never   Tobacco comments:    ceremonial reasons (only in July)  Vaping Use   Vaping status: Never Used  Substance and Sexual Activity   Alcohol use: Never   Drug use: Never   Sexual activity: Yes    Birth control/protection: None  Other Topics Concern   Not on file  Social History Narrative   Not on file   Social Drivers of Health   Financial Resource Strain: Not on file  Food Insecurity: Not on file  Transportation Needs: Not on file  Physical Activity: Not on file  Stress: Not on file  Social Connections: Not on file   No family history on  file. No Known Allergies Current Outpatient Medications  Medication Sig Dispense Refill   acetaminophen  (TYLENOL ) 500 MG tablet Take 1,000 mg by mouth every 6 (six) hours as needed for moderate pain or headache.     ibuprofen  (ADVIL ) 800 MG tablet Take 1 tablet (800 mg total) by mouth every 8 (eight) hours for 20 days. Please take with food, please alternate with acetaminophen  30 tablet 1   IRON PO Take 1 tablet by mouth 2 (two) times daily.     metFORMIN (GLUCOPHAGE) 500 MG tablet Take 500 mg by mouth 2 (two) times daily with a meal.     oxyCODONE  (ROXICODONE ) 5 MG immediate release tablet Take 1 tablet (5 mg total) by mouth every 4 (four) hours as needed. 15 tablet 0   No current facility-administered medications for this visit.   No results found.  Review of Systems:   A ROS was performed including pertinent positives and negatives as documented in the HPI.   Musculoskeletal Exam:    There were no vitals taken for this visit.  Left knee portals are well-appearing without erythema or drainage.  Mild knee effusion.  Range of motion is from 2-95.  She has good quad strength  Imaging:      I  personally reviewed and interpreted the radiographs.   Assessment:   6 weeks status post left knee medial meniscal root repair doing well.  At this time I would like her to work on full extension and have counseled her on working on loosening of the hamstrings as I do believe this is somewhat of a block to full extension murmur.  I will plan to see her back in 4 weeks for reassessment.  She will remain out of work until that time  Plan :    -Turn to clinic 4 weeks for reassessment      I personally saw and evaluated the patient, and participated in the management and treatment plan.  Wilhelmenia Harada, MD Attending Physician, Orthopedic Surgery  This document was dictated using Dragon voice recognition software. A reasonable attempt at proof reading has been made to minimize errors.

## 2023-08-15 ENCOUNTER — Ambulatory Visit (HOSPITAL_BASED_OUTPATIENT_CLINIC_OR_DEPARTMENT_OTHER): Attending: Orthopaedic Surgery | Admitting: Physical Therapy

## 2023-08-15 DIAGNOSIS — R2689 Other abnormalities of gait and mobility: Secondary | ICD-10-CM | POA: Insufficient documentation

## 2023-08-15 DIAGNOSIS — M25562 Pain in left knee: Secondary | ICD-10-CM | POA: Insufficient documentation

## 2023-08-15 DIAGNOSIS — M25662 Stiffness of left knee, not elsewhere classified: Secondary | ICD-10-CM | POA: Diagnosis present

## 2023-08-15 NOTE — Therapy (Signed)
 OUTPATIENT PHYSICAL THERAPY TREATMENT   Patient Name: Joy Cruz MRN: [id number] DOB:October 09, 1975, 48 y.o., female Today's Date: 08/15/2023  END OF SESSION:  PT End of Session - 08/15/23 0857     Visit Number 9    Number of Visits 16    Date for PT Re-Evaluation 08/29/23    PT Start Time 0850    PT Stop Time 0930    PT Time Calculation (min) 40 min    Activity Tolerance Patient tolerated treatment well    Behavior During Therapy [facility] for tasks assessed/performed                Past Medical History:  Diagnosis Date   Anemia    Diabetes mellitus without complication (HCC)    Past Surgical History:  Procedure Laterality Date   CESAREAN SECTION     CHONDROPLASTY Left 07/02/2023   Procedure: FEMORAL PATELLA CHONDROPLASTY;  Surgeon: Wilhelmenia Harada, MD;  Location: [facility];  Service: Orthopedics;  Laterality: Left;   KNEE ARTHROSCOPY WITH MENISCAL REPAIR Left 07/02/2023   Procedure: LEFT KNEE ARTHROSCOPY WITH MENISCAL REPAIR;  Surgeon: Wilhelmenia Harada, MD;  Location: [facility];  Service: Orthopedics;  Laterality: Left;   Patient Active Problem List   Diagnosis Date Noted   Tear of medial meniscus of left knee, current 07/02/2023   Establishing care with new doctor, encounter for 05/21/2023   Acute pain of left knee 05/21/2023   Diabetes mellitus without complication (HCC) 05/21/2023    PCP: Buddie Carina FNP  REFERRING PROVIDER: Dr Phoebe Breed MD   REFERRING DIAG: 1.  Left knee medial meniscal root repair with centralization 2.  Left knee medial femoral condyle abrasion arthroplasty  THERAPY DIAG:  Acute pain of left knee  Stiffness of left knee, not elsewhere classified  Other abnormalities of gait and mobility  Rationale for Evaluation and Treatment: Rehabilitation  ONSET DATE: 07/02/2023 (meniscus repair surgery)  SUBJECTIVE:   Per eval - Patient is a 47 year old female status post left knee medial meniscal root repair  with left knee medial femoral condyle abrasion arthroplasty on 07/02/2023.  The patient was stepping off a truck in August 2024 when she twisted her knee.  She had continuous pain until her surgical repair.  At this time she is still having high levels of pain.  She reports her pain fluctuates between 7 and 9.  She feels significant stiffness.  SUBJECTIVE STATEMENT: 08/15/2023 Pt reports she had a good visit with Dr. Hermina Loosen. Pleased with her ROM and progress.   PERTINENT HISTORY: DM2, C-section PAIN:  08/15/2023 7/10 medial/anterior knee    Per eval - Are you having pain? Yes: NPRS scale: 5/10 has been a consistant 9-10  Pain location: Medial knee Pain description: Aching Aggravating factors: standing and walking  Relieving factors: rest, ice and pain medication   PRECAUTIONS: None  RED FLAGS: None   WEIGHT BEARING RESTRICTIONS: Yes NWB 2 weeks   FALLS:  Has patient fallen in last 6 months? No  LIVING ENVIRONMENT: No steps into her house  OCCUPATION:   Drives a truck   Hobbies: Data processing manager    PLOF: Independent  PATIENT GOALS:  Get back to regular activity  NEXT MD VISIT:  Next week   OBJECTIVE:  Note: Objective measures were completed at Evaluation unless otherwise noted.  DIAGNOSTIC FINDINGS:    PATIENT SURVEYS:  LEFS 5/80  COGNITION: Overall cognitive status: Within functional limits for tasks assessed     SENSATION: Numbness  around the surgical site   EDEMA:  Circumferential: Will measure next visit  POSTURE: forward head  PALPATION: No unexpected tenderness to palpation   LOWER EXTREMITY ROM:  Passive ROM Right eval Left eval Left 07/12/23 Left 07/19/23 Left  4/15   Hip flexion       Hip extension       Hip abduction       Hip adduction       Hip internal rotation       Hip external rotation       Knee flexion  30 with physical therapist able to self assist with strap to 40 degrees AA: 46-48deg AA: 71 deg seated  w/ strap  AA: 94deg supine with strap  Knee extension  -5   -4  Ankle dorsiflexion       Ankle plantarflexion       Ankle inversion       Ankle eversion        (Blank rows = not tested)  LOWER EXTREMITY MMT:  MMT Right eval Left eval  Hip flexion    Hip extension    Hip abduction    Hip adduction    Hip internal rotation    Hip external rotation    Knee flexion    Knee extension    Ankle dorsiflexion    Ankle plantarflexion    Ankle inversion    Ankle eversion     (Blank rows = not tested) No formal MMT 2nd to recent surgery                                                                                                                                 TREATMENT DATE:  [facility] Adult PT Treatment:                                                DATE: 08/15/23 Therapeutic Exercise: Recumbent bike half revolutions x5 min total Prone knee hang 2x1 min with self over pressure Seated hamstring stretch 2x30" LAQ red TB 2x10 Hamstring red TB 2x10 Manual Therapy: IASTM and STM for hamstring Neuromuscular re-ed: Prone quad set 2x10 Seated SLR 2x10 Shuttle leg press 87# double leg x10, SL 37# x10, 42# x10   [facility] Adult PT Treatment:                                                DATE: 08/12/23 Therapeutic Exercise: Recumbent bike half revolutions x5 min total Prone knee hang 2x1 min with self over pressure Prone quad stretch with strap 2x30" Prone quad set 2x10 Standing HR/TR   Therapeutic Activity: Gait in hall with [facility] Weight shifts at railing  [facility] Adult PT Treatment:                                                DATE: 08/09/23 Therapeutic Exercise: Recumbent bike half revolutions x5 min total Prone knee hang 2x1 min with self over pressure Prone quad stretch with strap 2x30" Neuromuscular re-ed: Prone quad set 2x10 Prone knee flex/ext with strap assist 2x10 cues for terminal knee ext Therapeutic Activity: Standing hip abd red TB around thighs 2x10 Attempted  standing on L LE with full weight but unable to tolerate without feeling her knee is turning in Self Care: HEP updates   [facility] Adult PT Treatment:                                                DATE: 08/06/23 Therapeutic Exercise: Heel slide with strap 2x10  PROM L knee  Manual Therapy: Patellar mobs, STM L quad, HS  Therapeutic Activity: Sit<>stand x10 Gait with single crutch in hall Gait with SPC in hall Partial squats at rail with brace donned x8     [facility] Adult PT Treatment:                                                DATE: 08/01/23 Therapeutic Exercise: Heel slide with strap 2x10  Calf stretch with strap, knee extended x30" Prolonged knee ext stretch x30" Sidelying hip abduction 2x10 Seated hamstring curl 2x10 Manual Therapy: Patellar mobs, STM L quad Neuromuscular re-ed: Quad set w/ towel 3x10 Glute set heel press into towel with knee extended 3x10 Shuttle leg press 75# 2x10 Therapeutic Activity: Sit<>stand x10 Staggered stance fwd/bwd weight shift focusing on knee ext x10 L<>R weight shift focusing on knee ext x10     [facility] Adult PT Treatment:                                                DATE: 07/30/23 Therapeutic Exercise: PROM L knee Updated measurements Heel slide with strap x10 with PT MWM for knee flexion Quad set w/ towel 3x10 Sidelying hip abduction 2x10 Supine SLR with minA x10, no assist x10 Long sit HSS  30sec x3  Manual Therapy: STM to L quad        PATIENT EDUCATION:  Education details: HEP, symptom management, post op precautions and surgical protocol Person educated: Patient Education method: Explanation, Demonstration, Tactile cues, Verbal cues, and Handouts Education comprehension: verbalized understanding, returned demonstration, verbal cues required, tactile cues required, and needs further education  HOME EXERCISE PROGRAM: Access Code: [email or web address] URL: [email or web address] Date: 08/15/2023 Prepared by: Gellen  Aaralyn Marie Nonato  Exercises - 4 Way Patellar Glide  - 1 x daily - 7 x weekly - 1 sets - 10 reps - Staggered Stance Forward Backward Weight Shift with Unilateral Counter Support  - 1 x daily - 7 x weekly - 2 sets - 10 reps - Prone Knee Extension Hang  - 1 x daily - 7 x weekly -  2 sets - 1 min hold - Prone Knee Flexion  - 1 x daily - 7 x weekly - 2 sets - 10 reps - Prone Quadriceps Set  - 1 x daily - 7 x weekly - 2 sets - 10 reps - 3 set hold - Hip Abduction with Resistance Loop  - 1 x daily - 7 x weekly - 2 sets - 10 reps - Small Range Straight Leg Raise  - 1 x daily - 7 x weekly - 2 sets - 10 reps - Seated Knee Extension with Resistance  - 1 x daily - 7 x weekly - 2 sets - 10 reps - Seated Hamstring Curls with Resistance  - 1 x daily - 7 x weekly - 2 sets - 10 reps   ASSESSMENT:  CLINICAL IMPRESSION: 08/15/2023  Continued improvements with terminal knee extension and quad eccentric control with SLRs. Primarily focused on progressive strengthening quads and hamstrings.    Per eval - Patient is a 48 year old female status post left medial meniscal root repair and femoral condyle chondroplasty on 07/02/2023.  She presents with expected limitations in range of motion, function, and strength, she is nonweightbearing at this time.  Therapy reviewed gait training with the patient.  Therapy also adjusted the patient's brace to fit better.  She is limited by pain today.  Patient was given a limited HEP to begin working on base range of motion.  She a advised not to move range of motion past 90 degrees.  She is nowhere near 90 degrees at this time.  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  OBJECTIVE IMPAIRMENTS: Abnormal gait, decreased activity tolerance, decreased knowledge of condition, decreased knowledge of use of DME, decreased mobility, difficulty walking, decreased ROM, decreased strength, increased edema, and pain.    ACTIVITY LIMITATIONS: carrying, standing, squatting, sleeping, stairs, transfers, bed mobility, bathing, toileting, dressing, hygiene/grooming, and locomotion level  PARTICIPATION LIMITATIONS: meal prep, cleaning, laundry, driving, shopping, community activity, occupation, and yard work  PERSONAL FACTORS: high co-pay   REHAB POTENTIAL: Good  CLINICAL DECISION MAKING: Stable/uncomplicated  EVALUATION COMPLEXITY: Low   GOALS: Goals reviewed with patient? Yes  SHORT TERM GOALS: Target date: 08/01/2023   Patient will increase passive knee flexion and 90 degrees Baseline: Goal status: MET 4/15  2.  Patient will wean off crutches and increase weightbearing as tolerated Baseline:  Goal status: IN PROGRESS 4/15  3.  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  Patient will return to kayaking without pain Baseline:  Goal status: INITIAL  3.  Patient will go up and down 8 steps with reciprocal gait pattern without pain Baseline:  Goal status: INITIAL    PLAN:  PT FREQUENCY: 2x/week  PT DURATION: 8 weeks  PLANNED INTERVENTIONS: 97110-Therapeutic exercises, 97530- Therapeutic activity, [id number]- Neuromuscular re-education, 97535- Self Care, 16109- Manual therapy, [id number]- Gait training, [phone]- Aquatic Therapy, 97014- Electrical stimulation (unattended), 97035- Ultrasound, Patient/Family education, Stair training, Taping, Dry Needling, DME instructions, Cryotherapy, and Moist heat   PLAN FOR NEXT SESSION: Patient's visits may be limited by excessively high co-pay.  Based plan of care on how many visits we will be able to see her for.  Continue per meniscal repair protocol. Amb without a/d as tolerated.  Gellen Sakai Ma L Nonato, PT 08/15/2023 8:58 AM

## 2023-08-19 ENCOUNTER — Telehealth (HOSPITAL_BASED_OUTPATIENT_CLINIC_OR_DEPARTMENT_OTHER): Payer: Self-pay | Admitting: Physical Therapy

## 2023-08-19 ENCOUNTER — Ambulatory Visit (HOSPITAL_BASED_OUTPATIENT_CLINIC_OR_DEPARTMENT_OTHER): Admitting: Physical Therapy

## 2023-08-19 NOTE — Telephone Encounter (Signed)
 Prior to lunch, the patient contacted our office to confirm her appointment time, indicating she observed two different times--1:45 PM and 2:45 PM--on her phone. I verified and confirmed that her appointment was scheduled for 2:45 PM.  At 2:44 PM, the patient called again and left a voicemail expressing continued uncertainty about her appointment time, referencing the two different times previously mentioned, and stated she would not be attending the appointment.  It is unclear why the patient remained confused after the initial confirmation.

## 2023-08-22 ENCOUNTER — Encounter (HOSPITAL_BASED_OUTPATIENT_CLINIC_OR_DEPARTMENT_OTHER): Payer: Self-pay

## 2023-08-22 ENCOUNTER — Ambulatory Visit (HOSPITAL_BASED_OUTPATIENT_CLINIC_OR_DEPARTMENT_OTHER)

## 2023-08-22 DIAGNOSIS — M25562 Pain in left knee: Secondary | ICD-10-CM

## 2023-08-22 DIAGNOSIS — R2689 Other abnormalities of gait and mobility: Secondary | ICD-10-CM

## 2023-08-22 DIAGNOSIS — M25662 Stiffness of left knee, not elsewhere classified: Secondary | ICD-10-CM

## 2023-08-22 NOTE — Therapy (Signed)
 OUTPATIENT PHYSICAL THERAPY TREATMENT   Patient Name: Joy Cruz MRN: [id number] DOB:[birth year], 48 y.o., female Today's Date: 08/22/2023  END OF SESSION:  PT End of Session - 08/22/23 1456     Visit Number 10    Number of Visits 16    Date for PT Re-Evaluation 08/29/23    PT Start Time 1346    PT Stop Time 1430    PT Time Calculation (min) 44 min    Activity Tolerance Patient tolerated treatment well    Behavior During Therapy [facility] for tasks assessed/performed                 Past Medical History:  Diagnosis Date   Anemia    Diabetes mellitus without complication (HCC)    Past Surgical History:  Procedure Laterality Date   CESAREAN SECTION     CHONDROPLASTY Left 07/02/2023   Procedure: FEMORAL PATELLA CHONDROPLASTY;  Surgeon: Wilhelmenia Harada, MD;  Location: [facility];  Service: Orthopedics;  Laterality: Left;   KNEE ARTHROSCOPY WITH MENISCAL REPAIR Left 07/02/2023   Procedure: LEFT KNEE ARTHROSCOPY WITH MENISCAL REPAIR;  Surgeon: Wilhelmenia Harada, MD;  Location: [facility];  Service: Orthopedics;  Laterality: Left;   Patient Active Problem List   Diagnosis Date Noted   Tear of medial meniscus of left knee, current 07/02/2023   Establishing care with new doctor, encounter for 05/21/2023   Acute pain of left knee 05/21/2023   Diabetes mellitus without complication (HCC) 05/21/2023    PCP: Buddie Carina FNP  REFERRING PROVIDER: Dr Phoebe Breed MD   REFERRING DIAG: 1.  Left knee medial meniscal root repair with centralization 2.  Left knee medial femoral condyle abrasion arthroplasty  THERAPY DIAG:  Acute pain of left knee  Stiffness of left knee, not elsewhere classified  Other abnormalities of gait and mobility  Rationale for Evaluation and Treatment: Rehabilitation  ONSET DATE: 07/02/2023 (meniscus repair surgery)  SUBJECTIVE:   Per eval - Patient is a 48 year old female status post left knee medial meniscal root repair  with left knee medial femoral condyle abrasion arthroplasty on 07/02/2023.  The patient was stepping off a truck in August 2024 when she twisted her knee.  She had continuous pain until her surgical repair.  At this time she is still having high levels of pain.  She reports her pain fluctuates between 7 and 9.  She feels significant stiffness.  SUBJECTIVE STATEMENT: 08/22/2023 Pt reports she had a good visit with Dr. Hermina Loosen. Pleased with her ROM and progress.   PERTINENT HISTORY: DM2, C-section PAIN:  08/22/2023 7/10 medial/anterior knee    Per eval - Are you having pain? Yes: NPRS scale: 5/10 has been a consistant 9-10  Pain location: Medial knee Pain description: Aching Aggravating factors: standing and walking  Relieving factors: rest, ice and pain medication   PRECAUTIONS: None  RED FLAGS: None   WEIGHT BEARING RESTRICTIONS: Yes NWB 2 weeks   FALLS:  Has patient fallen in last 6 months? No  LIVING ENVIRONMENT: No steps into her house  OCCUPATION:   Drives a truck   Hobbies: Data processing manager    PLOF: Independent  PATIENT GOALS:  Get back to regular activity  NEXT MD VISIT:  Next week   OBJECTIVE:  Note: Objective measures were completed at Evaluation unless otherwise noted.  DIAGNOSTIC FINDINGS:    PATIENT SURVEYS:  LEFS 5/80  COGNITION: Overall cognitive status: Within functional limits for tasks assessed     SENSATION:  Numbness around the surgical site   EDEMA:  Circumferential: Will measure next visit  POSTURE: forward head  PALPATION: No unexpected tenderness to palpation   LOWER EXTREMITY ROM:  Passive ROM Right eval Left eval Left 07/12/23 Left 07/19/23 Left  4/15   Hip flexion       Hip extension       Hip abduction       Hip adduction       Hip internal rotation       Hip external rotation       Knee flexion  30 with physical therapist able to self assist with strap to 40 degrees AA: 46-48deg AA: 71 deg seated  w/ strap  AA: 94deg supine with strap  Knee extension  -5   -4  Ankle dorsiflexion       Ankle plantarflexion       Ankle inversion       Ankle eversion        (Blank rows = not tested)  LOWER EXTREMITY MMT:  MMT Right eval Left eval  Hip flexion    Hip extension    Hip abduction    Hip adduction    Hip internal rotation    Hip external rotation    Knee flexion    Knee extension    Ankle dorsiflexion    Ankle plantarflexion    Ankle inversion    Ankle eversion     (Blank rows = not tested) No formal MMT 2nd to recent surgery                                                                                                                                 TREATMENT DATE:   5/8: Bike partial revs PROM L knee flexion/extension STM to distal HS Patella mobilizations Heel slides 5" x15 Supine sLR 3x10 Long sit HSS x86min LAQ 3# 5" hold 2x10      [facility] Adult PT Treatment:                                                DATE: 08/15/23 Therapeutic Exercise: Recumbent bike half revolutions x5 min total Prone knee hang 2x1 min with self over pressure Seated hamstring stretch 2x30" LAQ red TB 2x10 Hamstring red TB 2x10 Manual Therapy: IASTM and STM for hamstring Neuromuscular re-ed: Prone quad set 2x10 Seated SLR 2x10 Shuttle leg press 87# double leg x10, SL 37# x10, 42# x10   [facility] Adult PT Treatment:                                                DATE: 08/12/23 Therapeutic Exercise: Recumbent bike half revolutions  x5 min total Prone knee hang 2x1 min with self over pressure Prone quad stretch with strap 2x30" Prone quad set 2x10 Standing HR/TR   Therapeutic Activity: Gait in hall with [facility] Weight shifts at railing   [facility] Adult PT Treatment:                                                DATE: 08/09/23 Therapeutic Exercise: Recumbent bike half revolutions x5 min total Prone knee hang 2x1 min with self over pressure Prone quad stretch with strap  2x30" Neuromuscular re-ed: Prone quad set 2x10 Prone knee flex/ext with strap assist 2x10 cues for terminal knee ext Therapeutic Activity: Standing hip abd red TB around thighs 2x10 Attempted standing on L LE with full weight but unable to tolerate without feeling her knee is turning in Self Care: HEP updates   [facility] Adult PT Treatment:                                                DATE: 08/06/23 Therapeutic Exercise: Heel slide with strap 2x10  PROM L knee  Manual Therapy: Patellar mobs, STM L quad, HS  Therapeutic Activity: Sit<>stand x10 Gait with single crutch in hall Gait with SPC in hall Partial squats at rail with brace donned x8    PATIENT EDUCATION:  Education details: HEP, symptom management, post op precautions and surgical protocol Person educated: Patient Education method: Explanation, Demonstration, Tactile cues, Verbal cues, and Handouts Education comprehension: verbalized understanding, returned demonstration, verbal cues required, tactile cues required, and needs further education  HOME EXERCISE PROGRAM: Access Code: [email or web address] URL: [email or web address] Date: 08/15/2023 Prepared by: Gellen Katrinna Marie Nonato  Exercises - 4 Way Patellar Glide  - 1 x daily - 7 x weekly - 1 sets - 10 reps - Staggered Stance Forward Backward Weight Shift with Unilateral Counter Support  - 1 x daily - 7 x weekly - 2 sets - 10 reps - Prone Knee Extension Hang  - 1 x daily - 7 x weekly - 2 sets - 1 min hold - Prone Knee Flexion  - 1 x daily - 7 x weekly - 2 sets - 10 reps - Prone Quadriceps Set  - 1 x daily - 7 x weekly - 2 sets - 10 reps - 3 set hold - Hip Abduction with Resistance Loop  - 1 x daily - 7 x weekly - 2 sets - 10 reps - Small Range Straight Leg Raise  - 1 x daily - 7 x weekly - 2 sets - 10 reps - Seated Knee Extension with Resistance  - 1 x daily - 7 x weekly - 2 sets - 10 reps - Seated Hamstring Curls with Resistance  - 1 x daily - 7 x weekly - 2  sets - 10 reps   ASSESSMENT:  CLINICAL IMPRESSION: Pt is 7 weeks s/p. She remains limited in knee flexion as well as terminal knee extension. Unable to complete full revolutions on recumbent bike as of yet. Spent time on PROM to address this. Visual improvement in both flexion and extension following this. Difficulty with supine SLR, though able to elevate slightly. Pt requires ongoing PT to address weakness and ROM deficits. Will continue to progress as  tolerated.    Per eval - Patient is a 48 year old female status post left medial meniscal root repair and femoral condyle chondroplasty on 07/02/2023.  She presents with expected limitations in range of motion, function, and strength, she is nonweightbearing at this time.  Therapy reviewed gait training with the patient.  Therapy also adjusted the patient's brace to fit better.  She is limited by pain today.  Patient was given a limited HEP to begin working on base range of motion.  She a advised not to move range of motion past 90 degrees.  She is nowhere near 90 degrees at this time.  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  OBJECTIVE IMPAIRMENTS: Abnormal gait, decreased activity tolerance, decreased knowledge of condition, decreased knowledge of use of DME, decreased mobility, difficulty walking, decreased ROM, decreased strength, increased edema, and pain.   ACTIVITY LIMITATIONS: carrying, standing, squatting, sleeping, stairs, transfers, bed mobility, bathing, toileting, dressing, hygiene/grooming, and locomotion level  PARTICIPATION LIMITATIONS: meal prep, cleaning, laundry, driving, shopping, community activity, occupation, and yard work  PERSONAL FACTORS: high co-pay   REHAB POTENTIAL: Good  CLINICAL DECISION MAKING: Stable/uncomplicated  EVALUATION COMPLEXITY: Low   GOALS: Goals reviewed with patient? Yes  SHORT TERM GOALS: Target date:  08/01/2023   Patient will increase passive knee flexion and 90 degrees Baseline: Goal status: MET 4/15  2.  Patient will wean off crutches and increase weightbearing as tolerated Baseline:  Goal status: IN PROGRESS 4/15  3.  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  Patient will return to kayaking without pain Baseline:  Goal status: INITIAL  3.  Patient will go up and down 8 steps with reciprocal gait pattern without pain Baseline:  Goal status: INITIAL    PLAN:  PT FREQUENCY: 2x/week  PT DURATION: 8 weeks  PLANNED INTERVENTIONS: 97110-Therapeutic exercises, 97530- Therapeutic activity, [id number]- Neuromuscular re-education, 97535- Self Care, 16109- Manual therapy, [id number]- Gait training, [phone]- Aquatic Therapy, 97014- Electrical stimulation (unattended), 97035- Ultrasound, Patient/Family education, Stair training, Taping, Dry Needling, DME instructions, Cryotherapy, and Moist heat   PLAN FOR NEXT SESSION: Patient's visits may be limited by excessively high co-pay.  Based plan of care on how many visits we will be able to see her for.  Continue per meniscal repair protocol. Amb without a/d as tolerated.    Fronie Jewett Dena Esperanza, PTA 08/22/2023 3:21 PM

## 2023-08-27 ENCOUNTER — Telehealth (HOSPITAL_BASED_OUTPATIENT_CLINIC_OR_DEPARTMENT_OTHER): Payer: Self-pay | Admitting: Orthopaedic Surgery

## 2023-08-27 ENCOUNTER — Other Ambulatory Visit: Payer: Self-pay | Admitting: Orthopaedic Surgery

## 2023-08-27 ENCOUNTER — Ambulatory Visit (HOSPITAL_BASED_OUTPATIENT_CLINIC_OR_DEPARTMENT_OTHER): Admitting: Physical Therapy

## 2023-08-27 ENCOUNTER — Encounter (HOSPITAL_BASED_OUTPATIENT_CLINIC_OR_DEPARTMENT_OTHER): Payer: Self-pay | Admitting: Physical Therapy

## 2023-08-27 DIAGNOSIS — R2689 Other abnormalities of gait and mobility: Secondary | ICD-10-CM

## 2023-08-27 DIAGNOSIS — M25662 Stiffness of left knee, not elsewhere classified: Secondary | ICD-10-CM

## 2023-08-27 DIAGNOSIS — M25562 Pain in left knee: Secondary | ICD-10-CM

## 2023-08-27 MED ORDER — METHYLPREDNISOLONE 4 MG PO TBPK: ORAL_TABLET | ORAL | 0 refills | Status: DC

## 2023-08-27 NOTE — Telephone Encounter (Signed)
 Patient  would like something for pain the oxycondone makes her sick and she needs something for swelling.

## 2023-08-27 NOTE — Therapy (Signed)
 OUTPATIENT PHYSICAL THERAPY TREATMENT   Patient Name: Joy Cruz MRN: [id number] DOB:1976/01/11, 48 y.o., female Today's Date: 08/27/2023  END OF SESSION:  PT End of Session - 08/27/23 1437     Visit Number 11    Number of Visits 16    Date for PT Re-Evaluation 08/29/23    PT Start Time 1406    PT Stop Time 1446    PT Time Calculation (min) 40 min    Activity Tolerance Patient tolerated treatment well    Behavior During Therapy [facility] for tasks assessed/performed                  Past Medical History:  Diagnosis Date   Anemia    Diabetes mellitus without complication (HCC)    Past Surgical History:  Procedure Laterality Date   CESAREAN SECTION     CHONDROPLASTY Left 07/02/2023   Procedure: FEMORAL PATELLA CHONDROPLASTY;  Surgeon: Wilhelmenia Harada, MD;  Location: [facility];  Service: Orthopedics;  Laterality: Left;   KNEE ARTHROSCOPY WITH MENISCAL REPAIR Left 07/02/2023   Procedure: LEFT KNEE ARTHROSCOPY WITH MENISCAL REPAIR;  Surgeon: Wilhelmenia Harada, MD;  Location: [facility];  Service: Orthopedics;  Laterality: Left;   Patient Active Problem List   Diagnosis Date Noted   Tear of medial meniscus of left knee, current 07/02/2023   Establishing care with new doctor, encounter for 05/21/2023   Acute pain of left knee 05/21/2023   Diabetes mellitus without complication (HCC) 05/21/2023    PCP: Buddie Carina FNP  REFERRING PROVIDER: Dr Phoebe Breed MD   REFERRING DIAG: 1.  Left knee medial meniscal root repair with centralization 2.  Left knee medial femoral condyle abrasion arthroplasty  THERAPY DIAG:  Acute pain of left knee  Stiffness of left knee, not elsewhere classified  Other abnormalities of gait and mobility  Rationale for Evaluation and Treatment: Rehabilitation  ONSET DATE: 07/02/2023 (meniscus repair surgery)  SUBJECTIVE:   Per eval - Patient is a 48 year old female status post left knee medial meniscal root  repair with left knee medial femoral condyle abrasion arthroplasty on 07/02/2023.  The patient was stepping off a truck in August 2024 when she twisted her knee.  She had continuous pain until her surgical repair.  At this time she is still having high levels of pain.  She reports her pain fluctuates between 7 and 9.  She feels significant stiffness.  SUBJECTIVE STATEMENT: Pt states she just came back from the beach and had pain and increase in swelling. She walked when going out to eat as well as onto the beach and pier.    PERTINENT HISTORY: DM2, C-section PAIN:  08/27/2023 7/10 medial/anterior knee    Per eval - Are you having pain? Yes: NPRS scale: 5/10 has been a consistant 9-10  Pain location: Medial knee Pain description: Aching Aggravating factors: standing and walking  Relieving factors: rest, ice and pain medication   PRECAUTIONS: None  RED FLAGS: None   WEIGHT BEARING RESTRICTIONS: Yes NWB 2 weeks   FALLS:  Has patient fallen in last 6 months? No  LIVING ENVIRONMENT: No steps into her house  OCCUPATION:   Drives a truck   Hobbies: Data processing manager    PLOF: Independent  PATIENT GOALS:  Get back to regular activity  NEXT MD VISIT:  Next week   OBJECTIVE:  Note: Objective measures were completed at Evaluation unless otherwise noted.  DIAGNOSTIC FINDINGS:    PATIENT SURVEYS:  LEFS 5/80  COGNITION: Overall cognitive status: Within functional limits for tasks assessed     SENSATION: Numbness around the surgical site   EDEMA:  Circumferential: Will measure next visit  POSTURE: forward head  PALPATION: No unexpected tenderness to palpation   LOWER EXTREMITY ROM:  Passive ROM Right eval Left eval Left 07/12/23 Left 07/19/23 Left  4/15   Hip flexion       Hip extension       Hip abduction       Hip adduction       Hip internal rotation       Hip external rotation       Knee flexion  30 with physical therapist able to  self assist with strap to 40 degrees AA: 46-48deg AA: 71 deg seated w/ strap  AA: 94deg supine with strap  Knee extension  -5   -4  Ankle dorsiflexion       Ankle plantarflexion       Ankle inversion       Ankle eversion        (Blank rows = not tested)  LOWER EXTREMITY MMT:  MMT Right eval Left eval  Hip flexion    Hip extension    Hip abduction    Hip adduction    Hip internal rotation    Hip external rotation    Knee flexion    Knee extension    Ankle dorsiflexion    Ankle plantarflexion    Ankle inversion    Ankle eversion     (Blank rows = not tested) No formal MMT 2nd to recent surgery                                                                                                                                 TREATMENT DATE:   5/13  Compression; swelling, pain management, ROM focus  Bike partial revs  Edema sweeping Calf stretch with quad set heel prop 2x10 3s LAQ 5lbs 2x10 Quad set with heel prop 2x10 2s TKE GTB 2x10  AD height adjustment- pt presents with [facility]   5/8: Bike partial revs PROM L knee flexion/extension STM to distal HS Patella mobilizations Heel slides 5" x15 Supine sLR 3x10 Long sit HSS x24min LAQ 3# 5" hold 2x10      [facility] Adult PT Treatment:                                                DATE: 08/15/23 Therapeutic Exercise: Recumbent bike half revolutions x5 min total Prone knee hang 2x1 min with self over pressure Seated hamstring stretch 2x30" LAQ red TB 2x10 Hamstring red TB 2x10 Manual Therapy: IASTM and STM for hamstring Neuromuscular re-ed: Prone quad set 2x10 Seated SLR 2x10 Shuttle leg press 87# double leg x10, SL 37# x10,  42# x10   [facility] Adult PT Treatment:                                                DATE: 08/12/23 Therapeutic Exercise: Recumbent bike half revolutions x5 min total Prone knee hang 2x1 min with self over pressure Prone quad stretch with strap 2x30" Prone quad set 2x10 Standing  HR/TR   Therapeutic Activity: Gait in hall with [facility] Weight shifts at railing   [facility] Adult PT Treatment:                                                DATE: 08/09/23 Therapeutic Exercise: Recumbent bike half revolutions x5 min total Prone knee hang 2x1 min with self over pressure Prone quad stretch with strap 2x30" Neuromuscular re-ed: Prone quad set 2x10 Prone knee flex/ext with strap assist 2x10 cues for terminal knee ext Therapeutic Activity: Standing hip abd red TB around thighs 2x10 Attempted standing on L LE with full weight but unable to tolerate without feeling her knee is turning in Self Care: HEP updates   [facility] Adult PT Treatment:                                                DATE: 08/06/23 Therapeutic Exercise: Heel slide with strap 2x10  PROM L knee  Manual Therapy: Patellar mobs, STM L quad, HS  Therapeutic Activity: Sit<>stand x10 Gait with single crutch in hall Gait with SPC in hall Partial squats at rail with brace donned x8    PATIENT EDUCATION:  Education details: HEP, symptom management, post op precautions and surgical protocol Person educated: Patient Education method: Explanation, Demonstration, Tactile cues, Verbal cues, and Handouts Education comprehension: verbalized understanding, returned demonstration, verbal cues required, tactile cues required, and needs further education  HOME EXERCISE PROGRAM: Access Code: [email or web address] URL: [email or web address] Date: 08/15/2023 Prepared by: Gellen Shaneca Marie Nonato  Exercises - 4 Way Patellar Glide  - 1 x daily - 7 x weekly - 1 sets - 10 reps - Staggered Stance Forward Backward Weight Shift with Unilateral Counter Support  - 1 x daily - 7 x weekly - 2 sets - 10 reps - Prone Knee Extension Hang  - 1 x daily - 7 x weekly - 2 sets - 1 min hold - Prone Knee Flexion  - 1 x daily - 7 x weekly - 2 sets - 10 reps - Prone Quadriceps Set  - 1 x daily - 7 x weekly - 2 sets - 10 reps - 3 set  hold - Hip Abduction with Resistance Loop  - 1 x daily - 7 x weekly - 2 sets - 10 reps - Small Range Straight Leg Raise  - 1 x daily - 7 x weekly - 2 sets - 10 reps - Seated Knee Extension with Resistance  - 1 x daily - 7 x weekly - 2 sets - 10 reps - Seated Hamstring Curls with Resistance  - 1 x daily - 7 x weekly - 2 sets - 10 reps   ASSESSMENT:  CLINICAL IMPRESSION: Pt is 8 weeks s/p.  Pt is significantly limited with ROM and flexion. Swelling is poor controlled and is limiting her ability to get into flexion and ext. Pt advised on focus to reduce swelling as well improving her knee ext. Pt timeline concerning due to level of swelling and ROM deficit. MD made aware. Quad firing is inhibited by amount of edema present. Pt did respond well to session today with knee ext to -3 and flexion to 95 by end of session with report of less fullness and swelling. Pt would benefit from continued skilled therapy in order to reach goals and maximize functional L LE strength and ROM for full return to ADL and occupation.   Per eval - Patient is a 48 year old female status post left medial meniscal root repair and femoral condyle chondroplasty on 07/02/2023.  She presents with expected limitations in range of motion, function, and strength, she is nonweightbearing at this time.  Therapy reviewed gait training with the patient.  Therapy also adjusted the patient's brace to fit better.  She is limited by pain today.  Patient was given a limited HEP to begin working on base range of motion.  She a advised not to move range of motion past 90 degrees.  She is nowhere near 90 degrees at this time.  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  OBJECTIVE IMPAIRMENTS: Abnormal gait, decreased activity tolerance, decreased knowledge of condition, decreased knowledge of use of DME, decreased mobility, difficulty walking, decreased ROM, decreased strength,  increased edema, and pain.   ACTIVITY LIMITATIONS: carrying, standing, squatting, sleeping, stairs, transfers, bed mobility, bathing, toileting, dressing, hygiene/grooming, and locomotion level  PARTICIPATION LIMITATIONS: meal prep, cleaning, laundry, driving, shopping, community activity, occupation, and yard work  PERSONAL FACTORS: high co-pay   REHAB POTENTIAL: Good  CLINICAL DECISION MAKING: Stable/uncomplicated  EVALUATION COMPLEXITY: Low   GOALS: Goals reviewed with patient? Yes  SHORT TERM GOALS: Target date: 08/01/2023   Patient will increase passive knee flexion and 90 degrees Baseline: Goal status: MET 4/15  2.  Patient will wean off crutches and increase weightbearing as tolerated Baseline:  Goal status: IN PROGRESS 4/15  3.  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  Patient will return to kayaking without pain Baseline:  Goal status: INITIAL  3.  Patient will go up and down 8 steps with reciprocal gait pattern without pain Baseline:  Goal status: INITIAL    PLAN:  PT FREQUENCY: 2x/week  PT DURATION: 8 weeks  PLANNED INTERVENTIONS: 97110-Therapeutic exercises, 97530- Therapeutic activity, [id number]- Neuromuscular re-education, 97535- Self Care, [id number]- Manual therapy, [id number]- Gait training, [phone]- Aquatic Therapy, [id number]- Electrical stimulation (unattended), 97035- Ultrasound, Patient/Family education, Stair training, Taping, Dry Needling, DME instructions, Cryotherapy, and Moist heat   PLAN FOR NEXT SESSION: Patient's visits may be limited by excessively high co-pay.  Based plan of care on how many visits we will be able to see her for.  Continue per meniscal repair protocol. Amb without a/d as tolerated.    Silver Dross, PT 08/27/2023 2:50 PM

## 2023-08-29 ENCOUNTER — Ambulatory Visit (HOSPITAL_BASED_OUTPATIENT_CLINIC_OR_DEPARTMENT_OTHER): Admitting: Physical Therapy

## 2023-08-29 ENCOUNTER — Encounter (HOSPITAL_BASED_OUTPATIENT_CLINIC_OR_DEPARTMENT_OTHER): Payer: Self-pay | Admitting: Physical Therapy

## 2023-08-29 DIAGNOSIS — M25562 Pain in left knee: Secondary | ICD-10-CM

## 2023-08-29 DIAGNOSIS — R2689 Other abnormalities of gait and mobility: Secondary | ICD-10-CM

## 2023-08-29 DIAGNOSIS — M25662 Stiffness of left knee, not elsewhere classified: Secondary | ICD-10-CM

## 2023-08-29 NOTE — Therapy (Signed)
 OUTPATIENT PHYSICAL THERAPY TREATMENT   Patient Name: Joy Cruz MRN: [id number] DOB:[birth year], 48 y.o., female Today's Date: 08/29/2023  END OF SESSION:  PT End of Session - 08/29/23 1348     Visit Number 12    Number of Visits 16    Date for PT Re-Evaluation 08/29/23    PT Start Time 1349    PT Stop Time 1430    PT Time Calculation (min) 41 min    Activity Tolerance Patient tolerated treatment well    Behavior During Therapy [facility] for tasks assessed/performed                  Past Medical History:  Diagnosis Date   Anemia    Diabetes mellitus without complication (HCC)    Past Surgical History:  Procedure Laterality Date   CESAREAN SECTION     CHONDROPLASTY Left 07/02/2023   Procedure: FEMORAL PATELLA CHONDROPLASTY;  Surgeon: Wilhelmenia Harada, MD;  Location: [facility];  Service: Orthopedics;  Laterality: Left;   KNEE ARTHROSCOPY WITH MENISCAL REPAIR Left 07/02/2023   Procedure: LEFT KNEE ARTHROSCOPY WITH MENISCAL REPAIR;  Surgeon: Wilhelmenia Harada, MD;  Location: [facility];  Service: Orthopedics;  Laterality: Left;   Patient Active Problem List   Diagnosis Date Noted   Tear of medial meniscus of left knee, current 07/02/2023   Establishing care with new doctor, encounter for 05/21/2023   Acute pain of left knee 05/21/2023   Diabetes mellitus without complication (HCC) 05/21/2023    PCP: Buddie Carina FNP  REFERRING PROVIDER: Dr Phoebe Breed MD   REFERRING DIAG: 1.  Left knee medial meniscal root repair with centralization 2.  Left knee medial femoral condyle abrasion arthroplasty  THERAPY DIAG:  Acute pain of left knee  Stiffness of left knee, not elsewhere classified  Other abnormalities of gait and mobility  Rationale for Evaluation and Treatment: Rehabilitation  ONSET DATE: 07/02/2023 (meniscus repair surgery)  SUBJECTIVE:   Per eval - Patient is a 48 year old female status post left knee medial meniscal root  repair with left knee medial femoral condyle abrasion arthroplasty on 07/02/2023.  The patient was stepping off a truck in August 2024 when she twisted her knee.  She had continuous pain until her surgical repair.  At this time she is still having high levels of pain.  She reports her pain fluctuates between 7 and 9.  She feels significant stiffness.  SUBJECTIVE STATEMENT: Pt states knee is still swollen. HEP going alright but limited with number of reps on some of them   PERTINENT HISTORY: DM2, C-section PAIN:  08/29/2023 7/10 medial/anterior knee    Per eval - Are you having pain? Yes: NPRS scale: has been a consistant 9-10  Pain location: Medial knee Pain description: Aching Aggravating factors: standing and walking  Relieving factors: rest, ice and pain medication   PRECAUTIONS: None  RED FLAGS: None   WEIGHT BEARING RESTRICTIONS: Yes NWB 2 weeks   FALLS:  Has patient fallen in last 6 months? No  LIVING ENVIRONMENT: No steps into her house  OCCUPATION:   Drives a truck   Hobbies: Data processing manager    PLOF: Independent  PATIENT GOALS:  Get back to regular activity  NEXT MD VISIT:  Next week   OBJECTIVE:  Note: Objective measures were completed at Evaluation unless otherwise noted.  DIAGNOSTIC FINDINGS:    PATIENT SURVEYS:  LEFS 5/80  COGNITION: Overall cognitive status: Within functional limits for tasks assessed  SENSATION: Numbness around the surgical site   EDEMA:  Circumferential: Will measure next visit  POSTURE: forward head  PALPATION: No unexpected tenderness to palpation   LOWER EXTREMITY ROM:  Passive ROM Right eval Left eval Left 07/12/23 Left 07/19/23 Left  4/15  Left 08/29/23  Hip flexion        Hip extension        Hip abduction        Hip adduction        Hip internal rotation        Hip external rotation        Knee flexion  30 with physical therapist able to self assist with strap to 40 degrees AA:  46-48deg AA: 71 deg seated w/ strap  AA: 94deg supine with strap 101 AROM  Knee extension  -5   -4 Lacking 13 Improves to 12  Ankle dorsiflexion        Ankle plantarflexion        Ankle inversion        Ankle eversion         (Blank rows = not tested)  LOWER EXTREMITY MMT:  MMT Right eval Left eval  Hip flexion    Hip extension    Hip abduction    Hip adduction    Hip internal rotation    Hip external rotation    Knee flexion    Knee extension    Ankle dorsiflexion    Ankle plantarflexion    Ankle inversion    Ankle eversion     (Blank rows = not tested) No formal MMT 2nd to recent surgery                                                                                                                                 TREATMENT DATE:  08/29/23 Bike partial revs 5 min Manual: retrograde edema massage Quad set with heel prop 5 x 10 second holds Supine active hamstring stretch 2 x 10 x 5 second holds  5/13  Compression; swelling, pain management, ROM focus  Bike partial revs  Edema sweeping Calf stretch with quad set heel prop 2x10 3s LAQ 5lbs 2x10 Quad set with heel prop 2x10 2s TKE GTB 2x10  AD height adjustment- pt presents with [facility]   5/8: Bike partial revs PROM L knee flexion/extension STM to distal HS Patella mobilizations Heel slides 5" x15 Supine sLR 3x10 Long sit HSS x15min LAQ 3# 5" hold 2x10      [facility] Adult PT Treatment:                                                DATE: 08/15/23 Therapeutic Exercise: Recumbent bike half revolutions x5 min total Prone knee hang 2x1 min with self over pressure Seated hamstring  stretch 2x30" LAQ red TB 2x10 Hamstring red TB 2x10 Manual Therapy: IASTM and STM for hamstring Neuromuscular re-ed: Prone quad set 2x10 Seated SLR 2x10 Shuttle leg press 87# double leg x10, SL 37# x10, 42# x10   [facility] Adult PT Treatment:                                                DATE: 08/12/23 Therapeutic  Exercise: Recumbent bike half revolutions x5 min total Prone knee hang 2x1 min with self over pressure Prone quad stretch with strap 2x30" Prone quad set 2x10 Standing HR/TR   Therapeutic Activity: Gait in hall with [facility] Weight shifts at railing   [facility] Adult PT Treatment:                                                DATE: 08/09/23 Therapeutic Exercise: Recumbent bike half revolutions x5 min total Prone knee hang 2x1 min with self over pressure Prone quad stretch with strap 2x30" Neuromuscular re-ed: Prone quad set 2x10 Prone knee flex/ext with strap assist 2x10 cues for terminal knee ext Therapeutic Activity: Standing hip abd red TB around thighs 2x10 Attempted standing on L LE with full weight but unable to tolerate without feeling her knee is turning in Self Care: HEP updates   [facility] Adult PT Treatment:                                                DATE: 08/06/23 Therapeutic Exercise: Heel slide with strap 2x10  PROM L knee  Manual Therapy: Patellar mobs, STM L quad, HS  Therapeutic Activity: Sit<>stand x10 Gait with single crutch in hall Gait with SPC in hall Partial squats at rail with brace donned x8    PATIENT EDUCATION:  Education details: HEP, symptom management, post op precautions and surgical protocol Person educated: Patient Education method: Explanation, Demonstration, Tactile cues, Verbal cues, and Handouts Education comprehension: verbalized understanding, returned demonstration, verbal cues required, tactile cues required, and needs further education  HOME EXERCISE PROGRAM: Access Code: [email or web address] URL: [email or web address] Date: 08/15/2023 Prepared by: Gellen Lajoyce Marie Nonato  Exercises - 4 Way Patellar Glide  - 1 x daily - 7 x weekly - 1 sets - 10 reps - Staggered Stance Forward Backward Weight Shift with Unilateral Counter Support  - 1 x daily - 7 x weekly - 2 sets - 10 reps - Prone Knee Extension Hang  - 1 x daily - 7 x weekly  - 2 sets - 1 min hold - Prone Knee Flexion  - 1 x daily - 7 x weekly - 2 sets - 10 reps - Prone Quadriceps Set  - 1 x daily - 7 x weekly - 2 sets - 10 reps - 3 set hold - Hip Abduction with Resistance Loop  - 1 x daily - 7 x weekly - 2 sets - 10 reps - Small Range Straight Leg Raise  - 1 x daily - 7 x weekly - 2 sets - 10 reps - Seated Knee Extension with Resistance  - 1 x daily - 7 x weekly - 2  sets - 10 reps - Seated Hamstring Curls with Resistance  - 1 x daily - 7 x weekly - 2 sets - 10 reps   ASSESSMENT:  CLINICAL IMPRESSION: Patient with increased edema limiting progress. Patient has met 1/3 short term goals and 0/3 long term goals with ability to complete HEP and improvement in ROM. Remaining goals not met due to continued deficits in symptoms, strength,, activity tolerance, gait, balance, edema, and functional mobility. Patient with ROM lacking 13 today due to edema. Extending POC 2x/week for 8 weeks to work toward remaining goals. Patient will continue to benefit from skilled physical therapy in order to improve function and reduce impairment.     Per eval - Patient is a 48 year old female status post left medial meniscal root repair and femoral condyle chondroplasty on 07/02/2023.  She presents with expected limitations in range of motion, function, and strength, she is nonweightbearing at this time.  Therapy reviewed gait training with the patient.  Therapy also adjusted the patient's brace to fit better.  She is limited by pain today.  Patient was given a limited HEP to begin working on base range of motion.  She a advised not to move range of motion past 90 degrees.  She is nowhere near 90 degrees at this time.  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  OBJECTIVE IMPAIRMENTS: Abnormal gait, decreased activity tolerance, decreased knowledge of condition, decreased knowledge of use of DME, decreased mobility,  difficulty walking, decreased ROM, decreased strength, increased edema, and pain.   ACTIVITY LIMITATIONS: carrying, standing, squatting, sleeping, stairs, transfers, bed mobility, bathing, toileting, dressing, hygiene/grooming, and locomotion level  PARTICIPATION LIMITATIONS: meal prep, cleaning, laundry, driving, shopping, community activity, occupation, and yard work  PERSONAL FACTORS: high co-pay   REHAB POTENTIAL: Good  CLINICAL DECISION MAKING: Stable/uncomplicated  EVALUATION COMPLEXITY: Low   GOALS: Goals reviewed with patient? Yes  SHORT TERM GOALS: Target date: 08/01/2023   Patient will increase passive knee flexion and 90 degrees Baseline: Goal status: MET 4/15  2.  Patient will wean off crutches and increase weightbearing as tolerated Baseline:  Goal status: IN PROGRESS 4/15  3.  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  Patient will return to kayaking without pain Baseline:  Goal status: INITIAL  3.  Patient will go up and down 8 steps with reciprocal gait pattern without pain Baseline:  Goal status: INITIAL    PLAN:  PT FREQUENCY: 2x/week  PT DURATION: 8 weeks  PLANNED INTERVENTIONS: 97110-Therapeutic exercises, 97530- Therapeutic activity, [id number]- Neuromuscular re-education, 97535- Self Care, 69629- Manual therapy, [id number]- Gait training, [phone]- Aquatic Therapy, [id number]- Electrical stimulation (unattended), 97035- Ultrasound, Patient/Family education, Stair training, Taping, Dry Needling, DME instructions, Cryotherapy, and Moist heat   PLAN FOR NEXT SESSION: Patient's visits may be limited by excessively high co-pay.  Based plan of care on how many visits we will be able to see her for.  Continue per meniscal repair protocol. Amb without a/d as tolerated.    Perfecto Bracket  Averyanna Sax, PT 08/29/2023 1:49 PM

## 2023-09-03 ENCOUNTER — Ambulatory Visit (HOSPITAL_BASED_OUTPATIENT_CLINIC_OR_DEPARTMENT_OTHER): Admitting: Physical Therapy

## 2023-09-03 ENCOUNTER — Telehealth (HOSPITAL_BASED_OUTPATIENT_CLINIC_OR_DEPARTMENT_OTHER): Payer: Self-pay | Admitting: Orthopaedic Surgery

## 2023-09-03 ENCOUNTER — Encounter (HOSPITAL_BASED_OUTPATIENT_CLINIC_OR_DEPARTMENT_OTHER): Payer: Self-pay | Admitting: Physical Therapy

## 2023-09-03 ENCOUNTER — Encounter (HOSPITAL_BASED_OUTPATIENT_CLINIC_OR_DEPARTMENT_OTHER): Payer: Self-pay

## 2023-09-03 DIAGNOSIS — M25562 Pain in left knee: Secondary | ICD-10-CM

## 2023-09-03 DIAGNOSIS — M25662 Stiffness of left knee, not elsewhere classified: Secondary | ICD-10-CM

## 2023-09-03 DIAGNOSIS — R2689 Other abnormalities of gait and mobility: Secondary | ICD-10-CM

## 2023-09-03 NOTE — Therapy (Signed)
 OUTPATIENT PHYSICAL THERAPY TREATMENT   Patient Name: Joy Cruz MRN: [id number] DOB:1975-04-19, 48 y.o., female Today's Date: 09/03/2023  END OF SESSION:  PT End of Session - 09/03/23 1403     Visit Number 13    Number of Visits 32    Date for PT Re-Evaluation 10/24/23    PT Start Time 1402    PT Stop Time 1443    PT Time Calculation (min) 41 min    Activity Tolerance Patient tolerated treatment well    Behavior During Therapy [facility] for tasks assessed/performed                  Past Medical History:  Diagnosis Date   Anemia    Diabetes mellitus without complication (HCC)    Past Surgical History:  Procedure Laterality Date   CESAREAN SECTION     CHONDROPLASTY Left 07/02/2023   Procedure: FEMORAL PATELLA CHONDROPLASTY;  Surgeon: Joy Harada, MD;  Location: [facility];  Service: Orthopedics;  Laterality: Left;   KNEE ARTHROSCOPY WITH MENISCAL REPAIR Left 07/02/2023   Procedure: LEFT KNEE ARTHROSCOPY WITH MENISCAL REPAIR;  Surgeon: Joy Harada, MD;  Location: [facility];  Service: Orthopedics;  Laterality: Left;   Patient Active Problem List   Diagnosis Date Noted   Tear of medial meniscus of left knee, current 07/02/2023   Establishing care with new doctor, encounter for 05/21/2023   Acute pain of left knee 05/21/2023   Diabetes mellitus without complication (HCC) 05/21/2023    PCP: Buddie Carina FNP  REFERRING PROVIDER: Dr Joy Breed MD   REFERRING DIAG: 1.  Left knee medial meniscal root repair with centralization 2.  Left knee medial femoral condyle abrasion arthroplasty  THERAPY DIAG:  Acute pain of left knee  Stiffness of left knee, not elsewhere classified  Other abnormalities of gait and mobility  Rationale for Evaluation and Treatment: Rehabilitation  ONSET DATE: 07/02/2023 (meniscus repair surgery)  SUBJECTIVE:   Per eval - Patient is a 48 year old female status post left knee medial meniscal root  repair with left knee medial femoral condyle abrasion arthroplasty on 07/02/2023.  The patient was stepping off a truck in August 2024 when she twisted her knee.  She had continuous pain until her surgical repair.  At this time she is still having high levels of pain.  She reports her pain fluctuates between 7 and 9.  She feels significant stiffness.  SUBJECTIVE STATEMENT: Pt states the pain is the same. The knee is still swollen but it has come down with the new steroid.   PERTINENT HISTORY: DM2, C-section PAIN:  09/03/2023 7/10 medial/anterior knee    Per eval - Are you having pain? Yes: NPRS scale: has been a consistant 9-10  Pain location: Medial knee Pain description: Aching Aggravating factors: standing and walking  Relieving factors: rest, ice and pain medication   PRECAUTIONS: None  RED FLAGS: None   WEIGHT BEARING RESTRICTIONS: Yes NWB 2 weeks   FALLS:  Has patient fallen in last 6 months? No  LIVING ENVIRONMENT: No steps into her house  OCCUPATION:   Drives a truck   Hobbies: Data processing manager    PLOF: Independent  PATIENT GOALS:  Get back to regular activity  NEXT MD VISIT:  Next week   OBJECTIVE:  Note: Objective measures were completed at Evaluation unless otherwise noted.  DIAGNOSTIC FINDINGS:    PATIENT SURVEYS:  LEFS 5/80  COGNITION: Overall cognitive status: Within functional limits for tasks assessed  SENSATION: Numbness around the surgical site   EDEMA:  Circumferential: Will measure next visit  POSTURE: forward head  PALPATION: No unexpected tenderness to palpation   LOWER EXTREMITY ROM:  Passive ROM Right eval Left eval Left 07/12/23 Left 07/19/23 Left  4/15  Left 08/29/23  Hip flexion        Hip extension        Hip abduction        Hip adduction        Hip internal rotation        Hip external rotation        Knee flexion  30 with physical therapist able to self assist with strap to 40 degrees AA:  46-48deg AA: 71 deg seated w/ strap  AA: 94deg supine with strap 101 AROM  Knee extension  -5   -4 Lacking 13 Improves to 12  Ankle dorsiflexion        Ankle plantarflexion        Ankle inversion        Ankle eversion         (Blank rows = not tested)  LOWER EXTREMITY MMT:  MMT Right eval Left eval  Hip flexion    Hip extension    Hip abduction    Hip adduction    Hip internal rotation    Hip external rotation    Knee flexion    Knee extension    Ankle dorsiflexion    Ankle plantarflexion    Ankle inversion    Ankle eversion     (Blank rows = not tested) No formal MMT 2nd to recent surgery                                                                                                                                 TREATMENT DATE:   5/20: Bike partial revs  LAQ 3# 5" hold  3x8 Bridge 3x8 SLR 3x8 HR/TR 3x8   08/29/23 Bike partial revs 5 min Manual: retrograde edema massage Quad set with heel prop 5 x 10 second holds Supine active hamstring stretch 2 x 10 x 5 second holds  5/13  Compression; swelling, pain management, ROM focus  Bike partial revs  Edema sweeping Calf stretch with quad set heel prop 2x10 3s LAQ 5lbs 2x10 Quad set with heel prop 2x10 2s TKE GTB 2x10  AD height adjustment- pt presents with [facility]   5/8: Bike partial revs PROM L knee flexion/extension STM to distal HS Patella mobilizations Heel slides 5" x15 Supine sLR 3x10 Long sit HSS x32min LAQ 3# 5" hold 2x10      [facility] Adult PT Treatment:                                                DATE:  08/15/23 Therapeutic Exercise: Recumbent bike half revolutions x5 min total Prone knee hang 2x1 min with self over pressure Seated hamstring stretch 2x30" LAQ red TB 2x10 Hamstring red TB 2x10 Manual Therapy: IASTM and STM for hamstring Neuromuscular re-ed: Prone quad set 2x10 Seated SLR 2x10 Shuttle leg press 87# double leg x10, SL 37# x10, 42# x10   [facility] Adult PT  Treatment:                                                DATE: 08/12/23 Therapeutic Exercise: Recumbent bike half revolutions x5 min total Prone knee hang 2x1 min with self over pressure Prone quad stretch with strap 2x30" Prone quad set 2x10 Standing HR/TR   Therapeutic Activity: Gait in hall with [facility] Weight shifts at railing   [facility] Adult PT Treatment:                                                DATE: 08/09/23 Therapeutic Exercise: Recumbent bike half revolutions x5 min total Prone knee hang 2x1 min with self over pressure Prone quad stretch with strap 2x30" Neuromuscular re-ed: Prone quad set 2x10 Prone knee flex/ext with strap assist 2x10 cues for terminal knee ext Therapeutic Activity: Standing hip abd red TB around thighs 2x10 Attempted standing on L LE with full weight but unable to tolerate without feeling her knee is turning in Self Care: HEP updates   [facility] Adult PT Treatment:                                                DATE: 08/06/23 Therapeutic Exercise: Heel slide with strap 2x10  PROM L knee  Manual Therapy: Patellar mobs, STM L quad, HS  Therapeutic Activity: Sit<>stand x10 Gait with single crutch in hall Gait with SPC in hall Partial squats at rail with brace donned x8    PATIENT EDUCATION:  Education details: HEP, symptom management, post op precautions and surgical protocol Person educated: Patient Education method: Explanation, Demonstration, Tactile cues, Verbal cues, and Handouts Education comprehension: verbalized understanding, returned demonstration, verbal cues required, tactile cues required, and needs further education  HOME EXERCISE PROGRAM: Access Code: [email or web address] URL: [email or web address] Date: 08/15/2023 Prepared by: Gellen Shekelia Marie Nonato  Exercises - 4 Way Patellar Glide  - 1 x daily - 7 x weekly - 1 sets - 10 reps - Staggered Stance Forward Backward Weight Shift with Unilateral Counter Support  - 1 x daily -  7 x weekly - 2 sets - 10 reps - Prone Knee Extension Hang  - 1 x daily - 7 x weekly - 2 sets - 1 min hold - Prone Knee Flexion  - 1 x daily - 7 x weekly - 2 sets - 10 reps - Prone Quadriceps Set  - 1 x daily - 7 x weekly - 2 sets - 10 reps - 3 set hold - Hip Abduction with Resistance Loop  - 1 x daily - 7 x weekly - 2 sets - 10 reps - Small Range Straight Leg Raise  - 1 x daily - 7 x weekly - 2  sets - 10 reps - Seated Knee Extension with Resistance  - 1 x daily - 7 x weekly - 2 sets - 10 reps - Seated Hamstring Curls with Resistance  - 1 x daily - 7 x weekly - 2 sets - 10 reps   ASSESSMENT:  CLINICAL IMPRESSION: Pt knee very irritable today with ROM and quad firing movements. Pt baseline swelling has decreased since starting the steroid taper but the knee is still highly irritable due to the volume of knee. Pt is -20 flexion and up to 100 flexion. Gait is antalgic throughout session. Quad setting and knee ext does improve symptoms during session. Patient will continue to benefit from skilled physical therapy in order to improve function and reduce impairment.     Per eval - Patient is a 48 year old female status post left medial meniscal root repair and femoral condyle chondroplasty on 07/02/2023.  She presents with expected limitations in range of motion, function, and strength, she is nonweightbearing at this time.  Therapy reviewed gait training with the patient.  Therapy also adjusted the patient's brace to fit better.  She is limited by pain today.  Patient was given a limited HEP to begin working on base range of motion.  She a advised not to move range of motion past 90 degrees.  She is nowhere near 90 degrees at this time.  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  OBJECTIVE IMPAIRMENTS: Abnormal gait, decreased activity tolerance, decreased knowledge of condition, decreased knowledge of use of DME, decreased  mobility, difficulty walking, decreased ROM, decreased strength, increased edema, and pain.   ACTIVITY LIMITATIONS: carrying, standing, squatting, sleeping, stairs, transfers, bed mobility, bathing, toileting, dressing, hygiene/grooming, and locomotion level  PARTICIPATION LIMITATIONS: meal prep, cleaning, laundry, driving, shopping, community activity, occupation, and yard work  PERSONAL FACTORS: high co-pay   REHAB POTENTIAL: Good  CLINICAL DECISION MAKING: Stable/uncomplicated  EVALUATION COMPLEXITY: Low   GOALS: Goals reviewed with patient? Yes  SHORT TERM GOALS: Target date: 08/01/2023   Patient will increase passive knee flexion and 90 degrees Baseline: Goal status: MET 4/15  2.  Patient will wean off crutches and increase weightbearing as tolerated Baseline:  Goal status: IN PROGRESS 4/15  3.  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  Patient will return to kayaking without pain Baseline:  Goal status: INITIAL  3.  Patient will go up and down 8 steps with reciprocal gait pattern without pain Baseline:  Goal status: INITIAL    PLAN:  PT FREQUENCY: 2x/week  PT DURATION: 8 weeks  PLANNED INTERVENTIONS: 97110-Therapeutic exercises, 97530- Therapeutic activity, [id number]- Neuromuscular re-education, 97535- Self Care, [id number]- Manual therapy, [id number]- Gait training, [id number]- Aquatic Therapy, 97014- Electrical stimulation (unattended), 97035- Ultrasound, Patient/Family education, Stair training, Taping, Dry Needling, DME instructions, Cryotherapy, and Moist heat   PLAN FOR NEXT SESSION: Patient's visits may be limited by excessively high co-pay.  Based plan of care on how many visits we will be able to see her for.  Continue per meniscal repair protocol. Amb without a/d as tolerated.    Silver Dross, PT 09/03/2023 2:45 PM

## 2023-09-03 NOTE — Telephone Encounter (Signed)
 Patient needs doctor note extended till July 10,2025 because they extend her PT. Please upload to her mychart.

## 2023-09-05 ENCOUNTER — Encounter (HOSPITAL_BASED_OUTPATIENT_CLINIC_OR_DEPARTMENT_OTHER): Payer: Self-pay | Admitting: Physical Therapy

## 2023-09-05 ENCOUNTER — Ambulatory Visit (HOSPITAL_BASED_OUTPATIENT_CLINIC_OR_DEPARTMENT_OTHER): Admitting: Physical Therapy

## 2023-09-05 DIAGNOSIS — M25662 Stiffness of left knee, not elsewhere classified: Secondary | ICD-10-CM

## 2023-09-05 DIAGNOSIS — M25562 Pain in left knee: Secondary | ICD-10-CM | POA: Diagnosis not present

## 2023-09-05 DIAGNOSIS — R2689 Other abnormalities of gait and mobility: Secondary | ICD-10-CM

## 2023-09-05 NOTE — Therapy (Signed)
 OUTPATIENT PHYSICAL THERAPY TREATMENT   Patient Name: Joy Cruz MRN: [id number] DOB:10/01/1975, 48 y.o., female Today's Date: 09/05/2023  END OF SESSION:  PT End of Session - 09/05/23 1449     Visit Number 14    Number of Visits 32    Date for PT Re-Evaluation 10/24/23    PT Start Time 1445    PT Stop Time 1526    PT Time Calculation (min) 41 min    Activity Tolerance Patient tolerated treatment well    Behavior During Therapy [facility] for tasks assessed/performed                  Past Medical History:  Diagnosis Date   Anemia    Diabetes mellitus without complication (HCC)    Past Surgical History:  Procedure Laterality Date   CESAREAN SECTION     CHONDROPLASTY Left 07/02/2023   Procedure: FEMORAL PATELLA CHONDROPLASTY;  Surgeon: Wilhelmenia Harada, MD;  Location: [facility];  Service: Orthopedics;  Laterality: Left;   KNEE ARTHROSCOPY WITH MENISCAL REPAIR Left 07/02/2023   Procedure: LEFT KNEE ARTHROSCOPY WITH MENISCAL REPAIR;  Surgeon: Wilhelmenia Harada, MD;  Location: [facility];  Service: Orthopedics;  Laterality: Left;   Patient Active Problem List   Diagnosis Date Noted   Tear of medial meniscus of left knee, current 07/02/2023   Establishing care with new doctor, encounter for 05/21/2023   Acute pain of left knee 05/21/2023   Diabetes mellitus without complication (HCC) 05/21/2023    PCP: Buddie Carina FNP  REFERRING PROVIDER: Dr Phoebe Breed MD   REFERRING DIAG: 1.  Left knee medial meniscal root repair with centralization 2.  Left knee medial femoral condyle abrasion arthroplasty  THERAPY DIAG:  Acute pain of left knee  Stiffness of left knee, not elsewhere classified  Other abnormalities of gait and mobility  Rationale for Evaluation and Treatment: Rehabilitation  ONSET DATE: 07/02/2023 (meniscus repair surgery)  SUBJECTIVE:   Per eval - Patient is a 48 year old female status post left knee medial meniscal root  repair with left knee medial femoral condyle abrasion arthroplasty on 07/02/2023.  The patient was stepping off a truck in August 2024 when she twisted her knee.  She had continuous pain until her surgical repair.  At this time she is still having high levels of pain.  She reports her pain fluctuates between 7 and 9.  She feels significant stiffness.  SUBJECTIVE STATEMENT: Pt states the knee feels less swollen. Pt was not in extra pain or sore after last session.   PERTINENT HISTORY: DM2, C-section PAIN:  09/05/2023 7/10 medial/anterior knee    Per eval - Are you having pain? Yes: NPRS scale: 5/10 Pain location: Medial knee Pain description: Aching Aggravating factors: standing and walking  Relieving factors: rest, ice and pain medication   PRECAUTIONS: None  RED FLAGS: None   WEIGHT BEARING RESTRICTIONS: Yes NWB 2 weeks   FALLS:  Has patient fallen in last 6 months? No  LIVING ENVIRONMENT: No steps into her house  OCCUPATION:   Drives a truck   Hobbies: Data processing manager    PLOF: Independent  PATIENT GOALS:  Get back to regular activity  NEXT MD VISIT:  Next week   OBJECTIVE:  Note: Objective measures were completed at Evaluation unless otherwise noted.  DIAGNOSTIC FINDINGS:    PATIENT SURVEYS:  LEFS 5/80  COGNITION: Overall cognitive status: Within functional limits for tasks assessed     SENSATION: Numbness around the surgical  site   EDEMA:  Circumferential: Will measure next visit  POSTURE: forward head  PALPATION: No unexpected tenderness to palpation   LOWER EXTREMITY ROM:  Passive ROM Right eval Left eval Left 07/12/23 Left 07/19/23 Left  4/15  Left 08/29/23  Hip flexion        Hip extension        Hip abduction        Hip adduction        Hip internal rotation        Hip external rotation        Knee flexion  30 with physical therapist able to self assist with strap to 40 degrees AA: 46-48deg AA: 71 deg seated w/  strap  AA: 94deg supine with strap 101 AROM  Knee extension  -5   -4 Lacking 13 Improves to 12  Ankle dorsiflexion        Ankle plantarflexion        Ankle inversion        Ankle eversion         (Blank rows = not tested)  LOWER EXTREMITY MMT:  MMT Right eval Left eval  Hip flexion    Hip extension    Hip abduction    Hip adduction    Hip internal rotation    Hip external rotation    Knee flexion    Knee extension    Ankle dorsiflexion    Ankle plantarflexion    Ankle inversion    Ankle eversion     (Blank rows = not tested) No formal MMT 2nd to recent surgery                                                                                                                                 TREATMENT DATE:    5/22  Bike partial revs  LAQ 5# 2" hold  3x10 Standing gastroc stretch 30s 3x Seated heel prop stretch 3 min (3 lbs on knee)   Heel rocking 2x20 HR/TR 20x     5/20: Bike partial revs  LAQ 3# 5" hold  3x8 Bridge 3x8 SLR 3x8 HR/TR 3x8   08/29/23 Bike partial revs 5 min Manual: retrograde edema massage Quad set with heel prop 5 x 10 second holds Supine active hamstring stretch 2 x 10 x 5 second holds  5/13  Compression; swelling, pain management, ROM focus  Bike partial revs  Edema sweeping Calf stretch with quad set heel prop 2x10 3s LAQ 5lbs 2x10 Quad set with heel prop 2x10 2s TKE GTB 2x10  AD height adjustment- pt presents with [facility]   5/8: Bike partial revs PROM L knee flexion/extension STM to distal HS Patella mobilizations Heel slides 5" x15 Supine sLR 3x10 Long sit HSS x69min LAQ 3# 5" hold 2x10      [facility] Adult PT Treatment:  DATE: 08/15/23 Therapeutic Exercise: Recumbent bike half revolutions x5 min total Prone knee hang 2x1 min with self over pressure Seated hamstring stretch 2x30" LAQ red TB 2x10 Hamstring red TB 2x10 Manual Therapy: IASTM and STM for  hamstring Neuromuscular re-ed: Prone quad set 2x10 Seated SLR 2x10 Shuttle leg press 87# double leg x10, SL 37# x10, 42# x10   [facility] Adult PT Treatment:                                                DATE: 08/12/23 Therapeutic Exercise: Recumbent bike half revolutions x5 min total Prone knee hang 2x1 min with self over pressure Prone quad stretch with strap 2x30" Prone quad set 2x10 Standing HR/TR   Therapeutic Activity: Gait in hall with [facility] Weight shifts at railing   [facility] Adult PT Treatment:                                                DATE: 08/09/23 Therapeutic Exercise: Recumbent bike half revolutions x5 min total Prone knee hang 2x1 min with self over pressure Prone quad stretch with strap 2x30" Neuromuscular re-ed: Prone quad set 2x10 Prone knee flex/ext with strap assist 2x10 cues for terminal knee ext Therapeutic Activity: Standing hip abd red TB around thighs 2x10 Attempted standing on L LE with full weight but unable to tolerate without feeling her knee is turning in Self Care: HEP updates   [facility] Adult PT Treatment:                                                DATE: 08/06/23 Therapeutic Exercise: Heel slide with strap 2x10  PROM L knee  Manual Therapy: Patellar mobs, STM L quad, HS  Therapeutic Activity: Sit<>stand x10 Gait with single crutch in hall Gait with SPC in hall Partial squats at rail with brace donned x8    PATIENT EDUCATION:  Education details: HEP, symptom management, post op precautions and surgical protocol Person educated: Patient Education method: Explanation, Demonstration, Tactile cues, Verbal cues, and Handouts Education comprehension: verbalized understanding, returned demonstration, verbal cues required, tactile cues required, and needs further education  HOME EXERCISE PROGRAM: Access Code: [email or web address] URL: [email or web address] Date: 08/15/2023 Prepared by: Gellen Richetta Marie Nonato  Exercises - 4 Way Patellar  Glide  - 1 x daily - 7 x weekly - 1 sets - 10 reps - Staggered Stance Forward Backward Weight Shift with Unilateral Counter Support  - 1 x daily - 7 x weekly - 2 sets - 10 reps - Prone Knee Extension Hang  - 1 x daily - 7 x weekly - 2 sets - 1 min hold - Prone Knee Flexion  - 1 x daily - 7 x weekly - 2 sets - 10 reps - Prone Quadriceps Set  - 1 x daily - 7 x weekly - 2 sets - 10 reps - 3 set hold - Hip Abduction with Resistance Loop  - 1 x daily - 7 x weekly - 2 sets - 10 reps - Small Range Straight Leg Raise  - 1 x daily - 7 x weekly -  2 sets - 10 reps - Seated Knee Extension with Resistance  - 1 x daily - 7 x weekly - 2 sets - 10 reps - Seated Hamstring Curls with Resistance  - 1 x daily - 7 x weekly - 2 sets - 10 reps   ASSESSMENT:  CLINICAL IMPRESSION: Pt able to progress knee ext and flexion at today's session with mild pain increase. Pt HEP progressed to more standing, LLLD stretching, as well as standing quad strengthening. Pt is still very swelling and pain limited. Advised strongly on elevation and compression for edema management. Patient will continue to benefit from skilled physical therapy in order to improve function and reduce impairment.     Per eval - Patient is a 48 year old female status post left medial meniscal root repair and femoral condyle chondroplasty on 07/02/2023.  She presents with expected limitations in range of motion, function, and strength, she is nonweightbearing at this time.  Therapy reviewed gait training with the patient.  Therapy also adjusted the patient's brace to fit better.  She is limited by pain today.  Patient was given a limited HEP to begin working on base range of motion.  She a advised not to move range of motion past 90 degrees.  She is nowhere near 90 degrees at this time.  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  OBJECTIVE IMPAIRMENTS: Abnormal gait, decreased activity  tolerance, decreased knowledge of condition, decreased knowledge of use of DME, decreased mobility, difficulty walking, decreased ROM, decreased strength, increased edema, and pain.   ACTIVITY LIMITATIONS: carrying, standing, squatting, sleeping, stairs, transfers, bed mobility, bathing, toileting, dressing, hygiene/grooming, and locomotion level  PARTICIPATION LIMITATIONS: meal prep, cleaning, laundry, driving, shopping, community activity, occupation, and yard work  PERSONAL FACTORS: high co-pay   REHAB POTENTIAL: Good  CLINICAL DECISION MAKING: Stable/uncomplicated  EVALUATION COMPLEXITY: Low   GOALS: Goals reviewed with patient? Yes  SHORT TERM GOALS: Target date: 08/01/2023   Patient will increase passive knee flexion and 90 degrees Baseline: Goal status: MET 4/15  2.  Patient will wean off crutches and increase weightbearing as tolerated Baseline:  Goal status: IN PROGRESS 4/15  3.  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  Patient will return to kayaking without pain Baseline:  Goal status: INITIAL  3.  Patient will go up and down 8 steps with reciprocal gait pattern without pain Baseline:  Goal status: INITIAL    PLAN:  PT FREQUENCY: 2x/week  PT DURATION: 8 weeks  PLANNED INTERVENTIONS: 97110-Therapeutic exercises, 97530- Therapeutic activity, [id number]- Neuromuscular re-education, 97535- Self Care, 09604- Manual therapy, [id number]- Gait training, [phone]- Aquatic Therapy, 97014- Electrical stimulation (unattended), 97035- Ultrasound, Patient/Family education, Stair training, Taping, Dry Needling, DME instructions, Cryotherapy, and Moist heat   PLAN FOR NEXT SESSION: Patient's visits may be limited by excessively high co-pay.  Based plan of care on how many visits we will be able to see  her for.  Continue per meniscal repair protocol. Amb without a/d as tolerated.    Silver Dross, PT 09/05/2023 3:33 PM

## 2023-09-11 ENCOUNTER — Encounter (HOSPITAL_BASED_OUTPATIENT_CLINIC_OR_DEPARTMENT_OTHER): Payer: Self-pay | Admitting: Physical Therapy

## 2023-09-11 ENCOUNTER — Ambulatory Visit (HOSPITAL_BASED_OUTPATIENT_CLINIC_OR_DEPARTMENT_OTHER): Admitting: Physical Therapy

## 2023-09-11 DIAGNOSIS — M25562 Pain in left knee: Secondary | ICD-10-CM | POA: Diagnosis not present

## 2023-09-11 DIAGNOSIS — M25662 Stiffness of left knee, not elsewhere classified: Secondary | ICD-10-CM

## 2023-09-11 DIAGNOSIS — R2689 Other abnormalities of gait and mobility: Secondary | ICD-10-CM

## 2023-09-11 NOTE — Therapy (Signed)
 OUTPATIENT PHYSICAL THERAPY TREATMENT   Patient Name: Joy Cruz MRN: [id number] DOB:[birth year], 48 y.o., female Today's Date: 09/11/2023  END OF SESSION:  PT End of Session - 09/11/23 1315     Visit Number 15    Number of Visits 32    Date for PT Re-Evaluation 10/24/23    PT Start Time 1315    PT Stop Time 1325    PT Time Calculation (min) 10 min    Activity Tolerance Patient tolerated treatment well    Behavior During Therapy [facility] for tasks assessed/performed                  Past Medical History:  Diagnosis Date   Anemia    Diabetes mellitus without complication (HCC)    Past Surgical History:  Procedure Laterality Date   CESAREAN SECTION     CHONDROPLASTY Left 07/02/2023   Procedure: FEMORAL PATELLA CHONDROPLASTY;  Surgeon: Wilhelmenia Harada, MD;  Location: [facility];  Service: Orthopedics;  Laterality: Left;   KNEE ARTHROSCOPY WITH MENISCAL REPAIR Left 07/02/2023   Procedure: LEFT KNEE ARTHROSCOPY WITH MENISCAL REPAIR;  Surgeon: Wilhelmenia Harada, MD;  Location: [facility];  Service: Orthopedics;  Laterality: Left;   Patient Active Problem List   Diagnosis Date Noted   Tear of medial meniscus of left knee, current 07/02/2023   Establishing care with new doctor, encounter for 05/21/2023   Acute pain of left knee 05/21/2023   Diabetes mellitus without complication (HCC) 05/21/2023    PCP: Buddie Carina FNP  REFERRING PROVIDER: Dr Phoebe Breed MD   REFERRING DIAG: 1.  Left knee medial meniscal root repair with centralization 2.  Left knee medial femoral condyle abrasion arthroplasty  THERAPY DIAG:  Acute pain of left knee  Stiffness of left knee, not elsewhere classified  Other abnormalities of gait and mobility  Rationale for Evaluation and Treatment: Rehabilitation  ONSET DATE: 07/02/2023 (meniscus repair surgery)  SUBJECTIVE:   Per eval - Patient is a 48 year old female status post left knee medial meniscal root  repair with left knee medial femoral condyle abrasion arthroplasty on 07/02/2023.  The patient was stepping off a truck in August 2024 when she twisted her knee.  She had continuous pain until her surgical repair.  At this time she is still having high levels of pain.  She reports her pain fluctuates between 7 and 9.  She feels significant stiffness.  SUBJECTIVE STATEMENT: Pt reports that the pain is increased today. She has had more swelling with increase in exercise. As she walks, it increases in swelling. Pain feels inside the knee.   PERTINENT HISTORY: DM2, C-section PAIN:  09/11/2023 7/10 medial/anterior knee    Per eval - Are you having pain? Yes: NPRS scale: 5/10 Pain location: Medial knee Pain description: Aching Aggravating factors: standing and walking  Relieving factors: rest, ice and pain medication   PRECAUTIONS: None  RED FLAGS: None   WEIGHT BEARING RESTRICTIONS: Yes NWB 2 weeks   FALLS:  Has patient fallen in last 6 months? No  LIVING ENVIRONMENT: No steps into her house  OCCUPATION:   Drives a truck   Hobbies: Data processing manager    PLOF: Independent  PATIENT GOALS:  Get back to regular activity  NEXT MD VISIT:  Next week   OBJECTIVE:  Note: Objective measures were completed at Evaluation unless otherwise noted.  DIAGNOSTIC FINDINGS:    PATIENT SURVEYS:  LEFS 5/80  COGNITION: Overall cognitive status: Within functional limits for  tasks assessed     SENSATION: Numbness around the surgical site   EDEMA:  Circumferential: Will measure next visit  POSTURE: forward head  PALPATION: No unexpected tenderness to palpation   LOWER EXTREMITY ROM:  Passive ROM Right eval Left eval Left 07/12/23 Left 07/19/23 Left  4/15  Left 08/29/23  Hip flexion        Hip extension        Hip abduction        Hip adduction        Hip internal rotation        Hip external rotation        Knee flexion  30 with physical therapist able  to self assist with strap to 40 degrees AA: 46-48deg AA: 71 deg seated w/ strap  AA: 94deg supine with strap 101 AROM  Knee extension  -5   -4 Lacking 13 Improves to 12  Ankle dorsiflexion        Ankle plantarflexion        Ankle inversion        Ankle eversion         (Blank rows = not tested)  LOWER EXTREMITY MMT:  MMT Right eval Left eval  Hip flexion    Hip extension    Hip abduction    Hip adduction    Hip internal rotation    Hip external rotation    Knee flexion    Knee extension    Ankle dorsiflexion    Ankle plantarflexion    Ankle inversion    Ankle eversion     (Blank rows = not tested) No formal MMT 2nd to recent surgery                                                                                                                                 TREATMENT DATE:   5/28  Bike revs with partial reps  Edu of swelling and pain management, HEP, POC    5/22  Bike partial revs  LAQ 5# 2" hold  3x10 Standing gastroc stretch 30s 3x Seated heel prop stretch 3 min (3 lbs on knee)   Heel rocking 2x20 HR/TR 20x     5/20: Bike partial revs  LAQ 3# 5" hold  3x8 Bridge 3x8 SLR 3x8 HR/TR 3x8   08/29/23 Bike partial revs 5 min Manual: retrograde edema massage Quad set with heel prop 5 x 10 second holds Supine active hamstring stretch 2 x 10 x 5 second holds  5/13  Compression; swelling, pain management, ROM focus  Bike partial revs  Edema sweeping Calf stretch with quad set heel prop 2x10 3s LAQ 5lbs 2x10 Quad set with heel prop 2x10 2s TKE GTB 2x10  AD height adjustment- pt presents with [facility]   5/8: Bike partial revs PROM L knee flexion/extension STM to distal HS Patella mobilizations Heel slides 5" x15 Supine sLR 3x10 Long sit  HSS x60min LAQ 3# 5" hold 2x10      [facility] Adult PT Treatment:                                                DATE: 08/15/23 Therapeutic Exercise: Recumbent bike half revolutions x5 min  total Prone knee hang 2x1 min with self over pressure Seated hamstring stretch 2x30" LAQ red TB 2x10 Hamstring red TB 2x10 Manual Therapy: IASTM and STM for hamstring Neuromuscular re-ed: Prone quad set 2x10 Seated SLR 2x10 Shuttle leg press 87# double leg x10, SL 37# x10, 42# x10   [facility] Adult PT Treatment:                                                DATE: 08/12/23 Therapeutic Exercise: Recumbent bike half revolutions x5 min total Prone knee hang 2x1 min with self over pressure Prone quad stretch with strap 2x30" Prone quad set 2x10 Standing HR/TR   Therapeutic Activity: Gait in hall with [facility] Weight shifts at railing   [facility] Adult PT Treatment:                                                DATE: 08/09/23 Therapeutic Exercise: Recumbent bike half revolutions x5 min total Prone knee hang 2x1 min with self over pressure Prone quad stretch with strap 2x30" Neuromuscular re-ed: Prone quad set 2x10 Prone knee flex/ext with strap assist 2x10 cues for terminal knee ext Therapeutic Activity: Standing hip abd red TB around thighs 2x10 Attempted standing on L LE with full weight but unable to tolerate without feeling her knee is turning in Self Care: HEP updates   [facility] Adult PT Treatment:                                                DATE: 08/06/23 Therapeutic Exercise: Heel slide with strap 2x10  PROM L knee  Manual Therapy: Patellar mobs, STM L quad, HS  Therapeutic Activity: Sit<>stand x10 Gait with single crutch in hall Gait with SPC in hall Partial squats at rail with brace donned x8    PATIENT EDUCATION:  Education details: HEP, symptom management, post op precautions and surgical protocol Person educated: Patient Education method: Explanation, Demonstration, Tactile cues, Verbal cues, and Handouts Education comprehension: verbalized understanding, returned demonstration, verbal cues required, tactile cues required, and needs further education  HOME EXERCISE  PROGRAM: Access Code: [email or web address] URL: [email or web address] Date: 08/15/2023 Prepared by: Gellen Channa Marie Nonato  Exercises - 4 Way Patellar Glide  - 1 x daily - 7 x weekly - 1 sets - 10 reps - Staggered Stance Forward Backward Weight Shift with Unilateral Counter Support  - 1 x daily - 7 x weekly - 2 sets - 10 reps - Prone Knee Extension Hang  - 1 x daily - 7 x weekly - 2 sets - 1 min hold - Prone Knee Flexion  - 1 x daily - 7 x weekly - 2 sets -  10 reps - Prone Quadriceps Set  - 1 x daily - 7 x weekly - 2 sets - 10 reps - 3 set hold - Hip Abduction with Resistance Loop  - 1 x daily - 7 x weekly - 2 sets - 10 reps - Small Range Straight Leg Raise  - 1 x daily - 7 x weekly - 2 sets - 10 reps - Seated Knee Extension with Resistance  - 1 x daily - 7 x weekly - 2 sets - 10 reps - Seated Hamstring Curls with Resistance  - 1 x daily - 7 x weekly - 2 sets - 10 reps   ASSESSMENT:  CLINICAL IMPRESSION:    Pt with increase in L knee pain and effusion of the knee that limits ability to meaningfully in therapy. Pt advised on pain management and swelling management. HEP and PT held at this time until pt returns to MD for further assessment. Patient will continue to benefit from skilled physical therapy in order to improve function and reduce impairment.     Per eval - Patient is a 48 year old female status post left medial meniscal root repair and femoral condyle chondroplasty on 07/02/2023.  She presents with expected limitations in range of motion, function, and strength, she is nonweightbearing at this time.  Therapy reviewed gait training with the patient.  Therapy also adjusted the patient's brace to fit better.  She is limited by pain today.  Patient was given a limited HEP to begin working on base range of motion.  She a advised not to move range of motion past 90 degrees.  She is nowhere near 90 degrees at this time.  She would benefit from skilled therapy to return to work as a  [employer] and also to return to active lifestyle.  OBJECTIVE IMPAIRMENTS: Abnormal gait, decreased activity tolerance, decreased knowledge of condition, decreased knowledge of use of DME, decreased mobility, difficulty walking, decreased ROM, decreased strength, increased edema, and pain.   ACTIVITY LIMITATIONS: carrying, standing, squatting, sleeping, stairs, transfers, bed mobility, bathing, toileting, dressing, hygiene/grooming, and locomotion level  PARTICIPATION LIMITATIONS: meal prep, cleaning, laundry, driving, shopping, community activity, occupation, and yard work  PERSONAL FACTORS: high co-pay   REHAB POTENTIAL: Good  CLINICAL DECISION MAKING: Stable/uncomplicated  EVALUATION COMPLEXITY: Low   GOALS: Goals reviewed with patient? Yes  SHORT TERM GOALS: Target date: 08/01/2023   Patient will increase passive knee flexion and 90 degrees Baseline: Goal status: MET 4/15  2.  Patient will wean off crutches and increase weightbearing as tolerated Baseline:  Goal status: IN PROGRESS 4/15  3.  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  Patient will return to kayaking without pain Baseline:  Goal status: INITIAL  3.  Patient will go up and down 8 steps with reciprocal gait pattern without pain Baseline:  Goal status: INITIAL    PLAN:  PT FREQUENCY: 2x/week  PT DURATION: 8 weeks  PLANNED INTERVENTIONS: 97110-Therapeutic exercises, 97530- Therapeutic activity, [id number]- Neuromuscular re-education, 97535- Self Care, 16109- Manual therapy, [id number]- Gait training, [phone]- Aquatic Therapy, 97014- Electrical stimulation (unattended), 97035- Ultrasound, Patient/Family education, Stair training, Taping, Dry Needling, DME instructions, Cryotherapy, and Moist heat   PLAN FOR NEXT SESSION: Patient's  visits may be limited by excessively high co-pay.  Based plan of care on how many visits we  will be able to see her for.  Continue per meniscal repair protocol. Amb without a/d as tolerated.    Silver Dross, PT 09/11/2023 1:56 PM

## 2023-09-12 ENCOUNTER — Ambulatory Visit: Admitting: Family Medicine

## 2023-09-12 ENCOUNTER — Encounter: Payer: Self-pay | Admitting: Family Medicine

## 2023-09-12 VITALS — BP 142/85 | HR 77 | Temp 99.1°F | Ht 68.0 in | Wt 286.0 lb

## 2023-09-12 DIAGNOSIS — R030 Elevated blood-pressure reading, without diagnosis of hypertension: Secondary | ICD-10-CM | POA: Insufficient documentation

## 2023-09-12 DIAGNOSIS — R5383 Other fatigue: Secondary | ICD-10-CM | POA: Diagnosis not present

## 2023-09-12 DIAGNOSIS — M62838 Other muscle spasm: Secondary | ICD-10-CM | POA: Insufficient documentation

## 2023-09-12 DIAGNOSIS — R21 Rash and other nonspecific skin eruption: Secondary | ICD-10-CM | POA: Insufficient documentation

## 2023-09-12 DIAGNOSIS — E119 Type 2 diabetes mellitus without complications: Secondary | ICD-10-CM | POA: Diagnosis not present

## 2023-09-12 DIAGNOSIS — L658 Other specified nonscarring hair loss: Secondary | ICD-10-CM | POA: Insufficient documentation

## 2023-09-12 MED ORDER — TRIAMCINOLONE ACETONIDE 0.1 % EX CREA: 1.0000 | TOPICAL_CREAM | Freq: Two times a day (BID) | CUTANEOUS | 0 refills | Status: AC

## 2023-09-12 MED ORDER — CLINDAMYCIN PHOSPHATE 1 % EX LOTN: TOPICAL_LOTION | Freq: Two times a day (BID) | CUTANEOUS | 0 refills | Status: AC

## 2023-09-12 MED ORDER — CYCLOBENZAPRINE HCL 10 MG PO TABS: 10.0000 mg | ORAL_TABLET | Freq: Three times a day (TID) | ORAL | 0 refills | Status: AC | PRN

## 2023-09-12 NOTE — Assessment & Plan Note (Signed)
 History of iron deficiency requiring iron infusions. Iron study today. May need hematology referral for infusions. Does not absorb iron orally.

## 2023-09-12 NOTE — Assessment & Plan Note (Signed)
 Reports not having diagnosis of hypertension. Taking lisinopril for renal protection. Not at goal today. DASH diet. Moderate weight loss. Exercise. Follow-up with provider at [facility] for elevated blood pressure.

## 2023-09-12 NOTE — Progress Notes (Addendum)
 Established Patient Office Visit  Subjective   Patient ID: Joy Cruz, female    DOB: 1975-08-06  Age: 48 y.o. MRN: [id number]  Chief Complaint  Patient presents with   Fatigue    Believes her iron levels are low   Alopecia   Spasms    Fatigue:  History of iron deficiency. Has gotten iron infusions in the past. Her body does not absorb iron.  No energy.   Muscle spasms: in bilateral thighs.  Present for one year. Slowly getting worse. Painful when this occurs.    Hair loss: large clumps of hair loss in shower in past 6 months.  Rash on face: Present for 5 days. Red raised rash diffusely.   Diabetes care: In [place]  Metformin 500 mg BID        ROS    Objective:     BP (!) 142/85 (BP Location: Left Arm, Patient Position: Sitting, Cuff Size: Large)   Pulse 77   Temp 99.1 F (37.3 C) (Oral)   Ht 5\' 8"  (1.727 m)   Wt 286 lb (129.7 kg)   LMP 07/14/2023 (Approximate)   SpO2 96%   BMI 43.49 kg/m    Physical Exam Vitals and nursing note reviewed.  Constitutional:      Appearance: Normal appearance.  Cardiovascular:     Rate and Rhythm: Regular rhythm.     Heart sounds: Normal heart sounds.  Pulmonary:     Effort: Pulmonary effort is normal.     Breath sounds: Normal breath sounds.  Skin:    General: Skin is warm and dry.  Neurological:     General: No focal deficit present.     Mental Status: She is alert. Mental status is at baseline.      No results found for any visits on 09/12/23.    The ASCVD Risk score (Arnett DK, et al., 2019) failed to calculate for the following reasons:   Cannot find a previous HDL lab   Cannot find a previous total cholesterol lab    Assessment & Plan:   Other fatigue Assessment & Plan: History of iron deficiency requiring iron infusions. Iron study today. May need hematology referral for infusions. Does not absorb iron orally.   Orders: -     Comprehensive metabolic panel with GFR -     Iron,  TIBC and Ferritin Panel -     CBC -     TSH+T4F+T3Free  Female pattern hair loss Assessment & Plan: Derm referral. Check thyroid function today.   Orders: -     TSH+T4F+T3Free  Muscle spasms of lower extremity Assessment & Plan: Muscle spasms in bilateral thighs. Symptoms present for one year. Check CMP. Flexeril 10 mg Q HS as needed.   Orders: -     Cyclobenzaprine HCl; Take 1 tablet (10 mg total) by mouth 3 (three) times daily as needed for muscle spasms.  Dispense: 30 tablet; Refill: 0  Diabetes mellitus without complication (HCC) Assessment & Plan: Managed by [place] services.  Taking metformin 500 mg BID Check A1C today.   Orders: -     Hemoglobin A1c  Rash and nonspecific skin eruption Assessment & Plan: Erythematous raised rash diffusely on face. Cleocin lotion while awaiting derm referral.   Scaly patch of dry skin on right elbow. Triamcinolone cream BID.    Orders: -     Ambulatory referral to Dermatology -     Clindamycin Phosphate; Apply topically 2 (two) times daily.  Dispense: 60 mL; Refill: 0 -  Triamcinolone Acetonide; Apply 1 Application topically 2 (two) times daily.  Dispense: 30 g; Refill: 0  Elevated blood pressure reading in office without diagnosis of hypertension Assessment & Plan: Reports not having diagnosis of hypertension. Taking lisinopril for renal protection. Not at goal today. DASH diet. Moderate weight loss. Exercise. Follow-up with provider at [facility] for elevated blood pressure.       Agrees with plan of care discussed.  Questions answered.   Return if symptoms worsen or fail to improve.    Mickiel Albany, FNP

## 2023-09-12 NOTE — Assessment & Plan Note (Signed)
 Managed by [place] services.  Taking metformin 500 mg BID Check A1C today.

## 2023-09-12 NOTE — Assessment & Plan Note (Signed)
 Derm referral. Check thyroid function today.

## 2023-09-12 NOTE — Assessment & Plan Note (Signed)
 Muscle spasms in bilateral thighs. Symptoms present for one year. Check CMP. Flexeril 10 mg Q HS as needed.

## 2023-09-12 NOTE — Assessment & Plan Note (Addendum)
 Erythematous raised rash diffusely on face. Cleocin lotion while awaiting derm referral.   Scaly patch of dry skin on right elbow. Triamcinolone cream BID.

## 2023-09-13 ENCOUNTER — Other Ambulatory Visit (HOSPITAL_BASED_OUTPATIENT_CLINIC_OR_DEPARTMENT_OTHER): Payer: Self-pay

## 2023-09-13 ENCOUNTER — Telehealth (HOSPITAL_BASED_OUTPATIENT_CLINIC_OR_DEPARTMENT_OTHER): Payer: Self-pay | Admitting: Orthopaedic Surgery

## 2023-09-13 ENCOUNTER — Ambulatory Visit (HOSPITAL_BASED_OUTPATIENT_CLINIC_OR_DEPARTMENT_OTHER): Admitting: Orthopaedic Surgery

## 2023-09-13 ENCOUNTER — Other Ambulatory Visit: Payer: Self-pay | Admitting: Family Medicine

## 2023-09-13 ENCOUNTER — Ambulatory Visit: Payer: Self-pay | Admitting: Family Medicine

## 2023-09-13 DIAGNOSIS — S83242A Other tear of medial meniscus, current injury, left knee, initial encounter: Secondary | ICD-10-CM

## 2023-09-13 DIAGNOSIS — D508 Other iron deficiency anemias: Secondary | ICD-10-CM

## 2023-09-13 DIAGNOSIS — D649 Anemia, unspecified: Secondary | ICD-10-CM | POA: Insufficient documentation

## 2023-09-13 LAB — COMPREHENSIVE METABOLIC PANEL WITH GFR
ALT: 16 IU/L (ref 0–32)
AST: 18 IU/L (ref 0–40)
Albumin: 4 g/dL (ref 3.9–4.9)
Alkaline Phosphatase: 140 IU/L — ABNORMAL HIGH (ref 44–121)
BUN/Creatinine Ratio: 21 (ref 9–23)
BUN: 13 mg/dL (ref 6–24)
Bilirubin Total: 0.2 mg/dL (ref 0.0–1.2)
CO2: 19 mmol/L — ABNORMAL LOW (ref 20–29)
Calcium: 9.2 mg/dL (ref 8.7–10.2)
Chloride: 105 mmol/L (ref 96–106)
Creatinine, Ser: 0.63 mg/dL (ref 0.57–1.00)
Globulin, Total: 2.8 g/dL (ref 1.5–4.5)
Glucose: 169 mg/dL — ABNORMAL HIGH (ref 70–99)
Potassium: 4.9 mmol/L (ref 3.5–5.2)
Sodium: 138 mmol/L (ref 134–144)
Total Protein: 6.8 g/dL (ref 6.0–8.5)
eGFR: 108 mL/min/{1.73_m2} (ref >59)

## 2023-09-13 LAB — IRON,TIBC AND FERRITIN PANEL
Ferritin: 5 ng/mL — ABNORMAL LOW (ref 15–150)
Iron Saturation: 5 % — CL (ref 15–55)
Iron: 20 ug/dL — ABNORMAL LOW (ref 27–159)
Total Iron Binding Capacity: 390 ug/dL (ref 250–450)
UIBC: 370 ug/dL (ref 131–425)

## 2023-09-13 LAB — CBC
Hematocrit: 28.7 % — ABNORMAL LOW (ref 34.0–46.6)
Hemoglobin: 8.8 g/dL — ABNORMAL LOW (ref 11.1–15.9)
MCH: 20.9 pg — ABNORMAL LOW (ref 26.6–33.0)
MCHC: 30.7 g/dL — ABNORMAL LOW (ref 31.5–35.7)
MCV: 68 fL — ABNORMAL LOW (ref 79–97)
Platelets: 421 10*3/uL (ref 150–450)
RBC: 4.22 x10E6/uL (ref 3.77–5.28)
RDW: 15.6 % — ABNORMAL HIGH (ref 11.7–15.4)
WBC: 8 10*3/uL (ref 3.4–10.8)

## 2023-09-13 LAB — TSH+T4F+T3FREE
Free T4: 1.22 ng/dL (ref 0.82–1.77)
T3, Free: 3.5 pg/mL (ref 2.0–4.4)
TSH: 1.27 u[IU]/mL (ref 0.450–4.500)

## 2023-09-13 LAB — HEMOGLOBIN A1C
Est. average glucose Bld gHb Est-mCnc: 151 mg/dL
Hgb A1c MFr Bld: 6.9 % — ABNORMAL HIGH (ref 4.8–5.6)

## 2023-09-13 MED ORDER — IBUPROFEN 800 MG PO TABS
800.0000 mg | ORAL_TABLET | Freq: Three times a day (TID) | ORAL | 0 refills | Status: AC
  Filled 2023-09-13: qty 30, 10d supply, fill #0

## 2023-09-13 MED ORDER — IBUPROFEN 800 MG PO TABS
800.0000 mg | ORAL_TABLET | Freq: Three times a day (TID) | ORAL | 0 refills | Status: AC | PRN
  Filled 2023-09-13: qty 30, 10d supply, fill #0

## 2023-09-13 NOTE — Progress Notes (Signed)
 Post Operative Evaluation    Procedure/Date of Surgery: Left knee medial meniscal root repair 3/18  Interval History:    Resents today 10 weeks status post the above procedure.  She is still been having persistent swelling and difficulty mobilizing. PMH/PSH/Family History/Social History/Meds/Allergies:    Past Medical History:  Diagnosis Date   Anemia    Diabetes mellitus without complication [facility])    Past Surgical History:  Procedure Laterality Date   CESAREAN SECTION     CHONDROPLASTY Left 07/02/2023   Procedure: FEMORAL PATELLA CHONDROPLASTY;  Surgeon: Wilhelmenia Harada, MD;  Location: [facility];  Service: Orthopedics;  Laterality: Left;   KNEE ARTHROSCOPY WITH MENISCAL REPAIR Left 07/02/2023   Procedure: LEFT KNEE ARTHROSCOPY WITH MENISCAL REPAIR;  Surgeon: Wilhelmenia Harada, MD;  Location: [facility];  Service: Orthopedics;  Laterality: Left;   Social History   Socioeconomic History   Marital status: Single    Spouse name: Not on file   Number of children: 3   Years of education: Not on file   Highest education level: Not on file  Occupational History   Not on file  Tobacco Use   Smoking status: Some Days    Types: Pipe    Passive exposure: Past   Smokeless tobacco: Never   Tobacco comments:    ceremonial reasons (only in July)  Vaping Use   Vaping status: Never Used  Substance and Sexual Activity   Alcohol use: Never   Drug use: Never   Sexual activity: Yes    Birth control/protection: None  Other Topics Concern   Not on file  Social History Narrative   Not on file   Social Drivers of Health   Financial Resource Strain: Not on file  Food Insecurity: Not on file  Transportation Needs: Not on file  Physical Activity: Not on file  Stress: Not on file  Social Connections: Not on file   No family history on file. No Known Allergies Current Outpatient Medications  Medication Sig Dispense  Refill   ibuprofen  (ADVIL ) 800 MG tablet Take 1 tablet (800 mg total) by mouth every 8 (eight) hours as needed. 30 tablet 0   ibuprofen  (ADVIL ) 800 MG tablet Take 1 tablet (800 mg total) by mouth every 8 (eight) hours for 10 days. Please take with food, please alternate with acetaminophen  30 tablet 0   acetaminophen  (TYLENOL ) 500 MG tablet Take 1,000 mg by mouth every 6 (six) hours as needed for moderate pain or headache.     clindamycin (CLEOCIN-T) 1 % lotion Apply topically 2 (two) times daily. 60 mL 0   cyclobenzaprine (FLEXERIL) 10 MG tablet Take 1 tablet (10 mg total) by mouth 3 (three) times daily as needed for muscle spasms. 30 tablet 0   ibuprofen  (ADVIL ) 800 MG tablet Take 800 mg by mouth every 8 (eight) hours as needed.     IRON PO Take 1 tablet by mouth 2 (two) times daily. (Patient not taking: Reported on 09/12/2023)     lisinopril (ZESTRIL) 20 MG tablet Take 20 mg by mouth daily.     metFORMIN (GLUCOPHAGE) 500 MG tablet Take 500 mg by mouth 2 (two) times daily with a meal.     triamcinolone cream (KENALOG) 0.1 % Apply 1 Application topically 2 (two) times daily. 30 g 0  No current facility-administered medications for this visit.   No results found.  Review of Systems:   A ROS was performed including pertinent positives and negatives as documented in the HPI.   Musculoskeletal Exam:    Last menstrual period 07/14/2023.  Left knee portals are well-appearing without erythema or drainage.  Mild knee effusion.  Range of motion is from 0-135.  She has good quad strength  Imaging:      I personally reviewed and interpreted the radiographs.   Assessment:   10 weeks status post left knee medial meniscal root repair doing well.  At this time she is still having some residual swelling consistent with synovitis.  To this fact I have offered her an ultrasound-guided injection of the knee.  Will plan to proceed with this today.  I will follow her back closely in 1 month we did  discuss the possibility of an MRI if she is not having a significant improvement in swelling as well as ambulation Plan :    - Left knee ultrasound-guided injection provided after verbal consent obtained        I personally saw and evaluated the patient, and participated in the management and treatment plan.  Wilhelmenia Harada, MD Attending Physician, Orthopedic Surgery  This document was dictated using Dragon voice recognition software. A reasonable attempt at proof reading has been made to minimize errors.

## 2023-09-13 NOTE — Telephone Encounter (Signed)
 Patient notified meds sent to [facility] pharmacy

## 2023-09-13 NOTE — Telephone Encounter (Signed)
 Patient wants ibuprofen  sent to pharmacy down stairs

## 2023-09-16 ENCOUNTER — Inpatient Hospital Stay

## 2023-09-16 ENCOUNTER — Inpatient Hospital Stay: Attending: Nurse Practitioner | Admitting: Nurse Practitioner

## 2023-09-16 ENCOUNTER — Other Ambulatory Visit: Payer: Self-pay | Admitting: Nurse Practitioner

## 2023-09-16 VITALS — BP 138/86 | HR 64 | Temp 98.0°F | Resp 17 | Wt 279.8 lb

## 2023-09-16 VITALS — BP 141/78 | HR 69 | Temp 98.7°F | Resp 15

## 2023-09-16 DIAGNOSIS — F17290 Nicotine dependence, other tobacco product, uncomplicated: Secondary | ICD-10-CM | POA: Insufficient documentation

## 2023-09-16 DIAGNOSIS — R5383 Other fatigue: Secondary | ICD-10-CM | POA: Insufficient documentation

## 2023-09-16 DIAGNOSIS — N920 Excessive and frequent menstruation with regular cycle: Secondary | ICD-10-CM | POA: Diagnosis not present

## 2023-09-16 DIAGNOSIS — Z79899 Other long term (current) drug therapy: Secondary | ICD-10-CM | POA: Diagnosis not present

## 2023-09-16 DIAGNOSIS — K5900 Constipation, unspecified: Secondary | ICD-10-CM | POA: Insufficient documentation

## 2023-09-16 DIAGNOSIS — E119 Type 2 diabetes mellitus without complications: Secondary | ICD-10-CM | POA: Diagnosis not present

## 2023-09-16 DIAGNOSIS — R0602 Shortness of breath: Secondary | ICD-10-CM | POA: Diagnosis not present

## 2023-09-16 DIAGNOSIS — D649 Anemia, unspecified: Secondary | ICD-10-CM

## 2023-09-16 DIAGNOSIS — D509 Iron deficiency anemia, unspecified: Secondary | ICD-10-CM | POA: Insufficient documentation

## 2023-09-16 MED ORDER — IRON SUCROSE 20 MG/ML IV SOLN
200.0000 mg | Freq: Once | INTRAVENOUS | Status: AC
  Administered 2023-09-16: 200 mg via INTRAVENOUS
  Filled 2023-09-16: qty 10

## 2023-09-16 MED ORDER — SODIUM CHLORIDE 0.9 % IV SOLN: INTRAVENOUS | Status: DC

## 2023-09-16 NOTE — Patient Instructions (Signed)

## 2023-09-16 NOTE — Progress Notes (Cosign Needed)
 [facility]   [phone] [phone]) [phone]   Clinic New consult Note   Patient Care Team: Mickiel Albany, FNP as PCP - General (Family Medicine) 09/16/2023  CHIEF COMPLAINTS/PURPOSE OF CONSULTATION:   The patient has been referred by her PCP. Was seen due to fatigue and recent hair loss. Recent labs showed low hgb of 8.8. iron was 20, iron saturation was 5, and ferritin level was 5. She has required iron infusions in the past. She does take oral iron everyday, however, this does not seem to be helping her iron levels. Also causes significant constipation. She states that she has been having iron deficiency since 2003, which is when her last child was born. In 2018, she had a total of 3 IV iron treatments. She states that her iron levels returned to normal"ish" after that and she has not required further treatment.  Today, the patient states that she is extremely fatigued. States that she could lay her head back and simply fall asleep at any time.  She reports shortness of breath yesterday. Made it difficult for her to sleep. Feels like this is contributing to fatigue today.  She reports very heavy menstrual periods.  First 2 to 3 days, she changes her pad or tampon every 2-3 hours.  Over next 5 days, bleeding is lighter.  Menstrual cycles are spacing out, longer that one month. She states that many of the women in her family have anemia related to their heavy menses. She has received IV iron in the past. This was in 2018, prior to moving to [place]. She states that after three treatments, her iron levels improved and she felt much better until past few months. She denies headaches or visual disturbances. She denies abdominal pain, nausea, vomiting, or changes in bowel or bladder habits.  She denies blood in her stool, blood in the urine, or throwing up blood. She denies unusual bleeding of any sort.  Socially, the patient is divorced. She lives with a friend. She has three adult  daughters. She works full time. She drives an 18 wheeler. She is currently out of work after having surgery to repair meniscus in her left knee. She denies smoking cigarettes on regular basis. She states that she does participate in [employer], often smoking a Peace Pipe, which is a mixture of tobacco, cedar, and other herbs. She does not drink alcohol, and she does not use illegal or illicit drugs.   HISTORY OF PRESENTING ILLNESS:  Joy Cruz 48 y.o. female is here because of iron deficiency anemia. She was found to have abnormal CBC from 09/12/2023 She denies recent chest pain on exertion. She did have episode of shortness of breath last night, making it difficult for her to sleep. She denies pre-syncopal episodes, or palpitations. She had not noticed any recent bleeding such as epistaxis, hematuria or hematochezia The patient denies over the counter NSAID ingestion. She is not on antiplatelets agents. She has not had screening colonoscopy. A referral will be made to GI provider for this today  She had no prior history or diagnosis of cancer. Her age appropriate screening programs are up-to-date. Last mammogram 12/25/2022 was negative. Will need referral for colonoscopy  She denies any pica and eats a variety of diet. She never donated blood or received blood transfusion. She has had IV iron treatment In the past.  The patient was not prescribed oral iron supplements. She takes otc iron supplement. She states that this frequently causes her to  be constipated.   REVIEW OF SYSTEMS:   Constitutional: Denies fevers, chills or abnormal night sweats. Severe fatigue.  Eyes: Denies blurriness of vision, double vision or watery eyes Ears, nose, mouth, throat, and face: Denies mucositis or sore throat Respiratory: Denies cough, dyspnea or wheezes. Did have single episode of shortness of breath last night, making it difficult for her to sleep.  Cardiovascular: Denies palpitation, chest  discomfort or lower extremity swelling Gastrointestinal:  Denies nausea, heartburn or change in bowel habits Skin: Denies abnormal skin rashes Lymphatics: Denies new lymphadenopathy or easy bruising Neurological:Denies numbness, tingling or new weaknesses Behavioral/Psych: Mood is stable, no new changes   All other systems were reviewed with the patient and are negative.   MEDICAL HISTORY:  Past Medical History:  Diagnosis Date   Anemia    Diabetes mellitus without complication (HCC)     SURGICAL HISTORY: Past Surgical History:  Procedure Laterality Date   CESAREAN SECTION     CHONDROPLASTY Left 07/02/2023   Procedure: FEMORAL PATELLA CHONDROPLASTY;  Surgeon: Wilhelmenia Harada, MD;  Location: [facility];  Service: Orthopedics;  Laterality: Left;   KNEE ARTHROSCOPY WITH MENISCAL REPAIR Left 07/02/2023   Procedure: LEFT KNEE ARTHROSCOPY WITH MENISCAL REPAIR;  Surgeon: Wilhelmenia Harada, MD;  Location: [facility];  Service: Orthopedics;  Laterality: Left;    SOCIAL HISTORY: Social History   Socioeconomic History   Marital status: Single    Spouse name: Not on file   Number of children: 3   Years of education: Not on file   Highest education level: Not on file  Occupational History   Not on file  Tobacco Use   Smoking status: Some Days    Types: Pipe    Passive exposure: Past   Smokeless tobacco: Never   Tobacco comments:    ceremonial reasons (only in July)  Vaping Use   Vaping status: Never Used  Substance and Sexual Activity   Alcohol use: Never   Drug use: Never   Sexual activity: Yes    Birth control/protection: None  Other Topics Concern   Not on file  Social History Narrative   Not on file   Social Drivers of Health   Financial Resource Strain: Not on file  Food Insecurity: No Food Insecurity (09/16/2023)   Hunger Vital Sign    Worried About Running Out of Food in the Last Year: Never true    Ran Out of Food in the Last Year:  Never true  Transportation Needs: No Transportation Needs (09/16/2023)   PRAPARE - Administrator, Civil Service (Medical): No    Lack of Transportation (Non-Medical): No  Physical Activity: Not on file  Stress: Not on file  Social Connections: Not on file  Intimate Partner Violence: Not At Risk (09/16/2023)   Humiliation, Afraid, Rape, and Kick questionnaire    Fear of Current or Ex-Partner: No    Emotionally Abused: No    Physically Abused: No    Sexually Abused: No    FAMILY HISTORY: No family history on file.  ALLERGIES:  has no known allergies.  MEDICATIONS:  Current Outpatient Medications  Medication Sig Dispense Refill   acetaminophen  (TYLENOL ) 500 MG tablet Take 1,000 mg by mouth every 6 (six) hours as needed for moderate pain or headache.     clindamycin  (CLEOCIN -T) 1 % lotion Apply topically 2 (two) times daily. 60 mL 0   cyclobenzaprine  (FLEXERIL ) 10 MG tablet Take 1 tablet (10 mg total) by mouth 3 (three)  times daily as needed for muscle spasms. 30 tablet 0   ibuprofen  (ADVIL ) 800 MG tablet Take 800 mg by mouth every 8 (eight) hours as needed.     ibuprofen  (ADVIL ) 800 MG tablet Take 1 tablet (800 mg total) by mouth every 8 (eight) hours as needed. 30 tablet 0   ibuprofen  (ADVIL ) 800 MG tablet Take 1 tablet (800 mg total) by mouth every 8 (eight) hours for 10 days. Please take with food, please alternate with acetaminophen  30 tablet 0   IRON PO Take 1 tablet by mouth 2 (two) times daily.     lisinopril (ZESTRIL) 20 MG tablet Take 20 mg by mouth daily.     metFORMIN (GLUCOPHAGE) 500 MG tablet Take 500 mg by mouth 2 (two) times daily with a meal.     triamcinolone  cream (KENALOG ) 0.1 % Apply 1 Application topically 2 (two) times daily. 30 g 0   No current facility-administered medications for this visit.   Facility-Administered Medications Ordered in Other Visits  Medication Dose Route Frequency Provider Last Rate Last Admin   0.9 %  sodium chloride  infusion    Intravenous Continuous Sharyon Deis, NP   Stopped at 09/16/23 1621    PHYSICAL EXAMINATION: ECOG PERFORMANCE STATUS: 1 - Symptomatic but completely ambulatory  Vitals:   09/16/23 1346 09/16/23 1349  BP: (!) 150/90 138/86  Pulse: 64   Resp: 17   Temp: 98 F (36.7 C)   SpO2: 99%    Filed Weights   09/16/23 1346  Weight: 279 lb 12.8 oz (126.9 kg)    GENERAL:alert, no distress and comfortable SKIN: skin color, texture, turgor are normal, no rashes or significant lesions EYES: normal, conjunctiva are pink and non-injected, sclera clear OROPHARYNX:no exudate, no erythema and lips, buccal mucosa, and tongue normal  NECK: supple, thyroid normal size, non-tender, without nodularity LYMPH:  no palpable lymphadenopathy in the cervical, axillary or inguinal LUNGS: clear to auscultation and percussion with normal breathing effort HEART: regular rate & rhythm and no murmurs and no lower extremity edema ABDOMEN:abdomen soft, non-tender and normal bowel sounds Musculoskeletal:no cyanosis of digits and no clubbing  PSYCH: alert & oriented x 3 with fluent speech NEURO: no focal motor/sensory deficits  LABORATORY DATA:  I have reviewed the data as listed    Latest Ref Rng & Units 09/12/2023    9:50 AM 03/27/2022    8:16 PM 03/27/2022    3:40 PM  CBC  WBC 3.4 - 10.8 x10E3/uL 8.0   5.7   Hemoglobin 11.1 - 15.9 g/dL 8.8  8.5  8.0   Hematocrit 34.0 - 46.6 % 28.7  25.0  28.6   Platelets 150 - 450 x10E3/uL 421   530        Latest Ref Rng & Units 09/12/2023    9:50 AM 06/25/2023   12:30 PM 03/27/2022    8:16 PM  CMP  Glucose 70 - 99 mg/dL 161  096  045   BUN 6 - 24 mg/dL 13  16  11    Creatinine 0.57 - 1.00 mg/dL 4.09  8.11  9.14   Sodium 134 - 144 mmol/L 138  139  138   Potassium 3.5 - 5.2 mmol/L 4.9  5.1  3.7   Chloride 96 - 106 mmol/L 105  106  105   CO2 20 - 29 mmol/L 19  24    Calcium 8.7 - 10.2 mg/dL 9.2  8.8    Total Protein 6.0 - 8.5 g/dL 6.8  Total Bilirubin 0.0 - 1.2  mg/dL 0.2     Alkaline Phos 44 - 121 IU/L 140     AST 0 - 40 IU/L 18     ALT 0 - 32 IU/L 16      Assessment and plan: Iron deficiency anemia, unspecified iron deficiency anemia type Assessment & Plan: referred by her PCP. Was seen due to fatigue and recent hair loss. Recent labs showed low hgb of 8.8. iron was 20, iron saturation was 5, and ferritin level was 5. She has required iron infusions in the past. She does take oral iron everyday, however, this does not seem to be helping her iron levels. Also causes significant constipation. She states that she has been having iron deficiency since 2003, which is when her last child was born. In 2018, she had a total of 3 IV iron treatments. She states that her iron levels returned to normal"ish" after that and she has not required further treatment.  Discussed with patient, that most likely cause of her iron deficiency anemia is blood loss from heavy menstrual cycles. This is the most common cause of iron deficiency anemia in women who have a monthly menstrual cycle. Will treat with IV Venofer 200 mg today. Will arrange for weekly Venofer 200 mg after she returns from travel around 09/28/2023. Will recheck labs and follow up with her in approximately 8 weeks. Will add B12, folate, and reticulocytes to labs. Will check celiac screening labs. Refer to gastroenterology for screening colonoscopy and possible endoscopy to evaluate for occult reasons for blood loss.   Orders: -     Vitamin B12; Future -     Folate; Future -     Reticulocytes; Future -     Iron and Iron Binding Capacity ([facility] only); Future -     Ferritin; Future -     CBC with Differential (Cancer Center Only); Future -     Celiac panel 10; Future -     Ambulatory referral to Gastroenterology  Symptomatic anemia Assessment & Plan: referred by her PCP. Was seen due to fatigue and recent hair loss. Recent labs showed low hgb of 8.8. iron was 20, iron saturation was 5, and ferritin level  was 5. She has required iron infusions in the past. She does take oral iron everyday, however, this does not seem to be helping her iron levels. Also causes significant constipation. She states that she has been having iron deficiency since 2003, which is when her last child was born. In 2018, she had a total of 3 IV iron treatments. She states that her iron levels returned to normal"ish" after that and she has not required further treatment.  Discussed with patient, that most likely cause of her iron deficiency anemia is blood loss from heavy menstrual cycles. This is the most common cause of iron deficiency anemia in women who have a monthly menstrual cycle. Will treat with IV Venofer 200 mg today. Will arrange for weekly Venofer 200 mg after she returns from travel around 09/28/2023. Will recheck labs and follow up with her in approximately 8 weeks. Will add B12, folate, and reticulocytes to labs. Will check celiac screening labs. Refer to gastroenterology for screening colonoscopy and possible endoscopy to evaluate for occult reasons for blood loss.   Orders: -     Celiac panel 10; Future -     Ambulatory referral to Gastroenterology     Orders Placed This Encounter  Procedures   Vitamin B12  Standing Status:   Future    Expected Date:   11/16/2023    Expiration Date:   09/15/2024   Folate    Standing Status:   Future    Expected Date:   11/16/2023    Expiration Date:   09/15/2024   Reticulocytes    Standing Status:   Future    Expected Date:   11/16/2023    Expiration Date:   09/15/2024   Iron and Iron Binding Capacity ([facility] only)    Standing Status:   Future    Expected Date:   11/16/2023    Expiration Date:   09/15/2024   Ferritin    Standing Status:   Future    Expected Date:   11/16/2023    Expiration Date:   09/15/2024   CBC with Differential (Cancer Center Only)    Standing Status:   Future    Expected Date:   11/16/2023    Expiration Date:   09/15/2024   Celiac panel 10    Standing  Status:   Future    Expected Date:   11/16/2023    Expiration Date:   09/15/2024   Ambulatory referral to Gastroenterology    Referral Priority:   Routine    Referral Type:   Consultation    Referral Reason:   Specialty Services Required    Number of Visits Requested:   1    The patient was seen along with Dr. Maryalice Smaller today. All questions were answered. The patient knows to call the clinic with any problems, questions or concerns. The total time spent in the appointment was 30 minutes.     Sharyon Deis, NP 09/16/2023 4:36 PM  Addendum I have seen the patient, examined her. I agree with the assessment and and plan and have edited the notes.   48 year old female, was referred for anemia and iron deficiency.  She is symptomatic with fatigue and hair loss.  She does have menorrhagia which is likely the cause of her iron deficiency.  Her outside lab reviewed, which is consistent with iron deficient anemia.  She is not responding well to oral iron, and also has constipation from oral iron.  I recommend IV iron, will start Venofer 200 mg today, and the plan to give her a total of 1 g. She is a 48 year old, due for screening colonoscopy, also reasonable to get an EGD due to her iron deficiency.  Will obtain celiac panel due to her poor absorption of oral iron.  Will follow-up her CBC closely.  If her anemia does not resolve with adequate IV iron, we will obtain additional anemia workup.  All questions were answered.  I spent a total of 30 minutes for her visit today.  Sonja Hamlin MD 09/16/2023

## 2023-09-16 NOTE — Progress Notes (Signed)
 Orders for IV venofer 200 mg weekly sent to [facility] infusion center. Plan to start in approximately 2 weeks. The patient received initial dose venofer 200 mg her at cancer center on 09/16/2023. Plan for her to receive a total dose of 1000 mg. Will then recheck labs and follow up with her.  Rande Bushy, NP

## 2023-09-16 NOTE — Progress Notes (Signed)
 Per Rande Bushy, NP- ok to use labs from 09/12/23.  Patient tolerated her iron infusion well today. After her 30 minute observation, patient had no s/s of any reaction. VSS-  BP (!) 141/78 (BP Location: Left Arm, Patient Position: Sitting)   Pulse 69   Temp 98.7 F (37.1 C) (Oral)   Resp 15   LMP 07/14/2023 (Approximate)   SpO2 100%   Ambulatory to the lobby with a cane due to recent knee surgery.

## 2023-09-16 NOTE — Assessment & Plan Note (Signed)
 referred by her PCP. Was seen due to fatigue and recent hair loss. Recent labs showed low hgb of 8.8. iron was 20, iron saturation was 5, and ferritin level was 5. She has required iron infusions in the past. She does take oral iron everyday, however, this does not seem to be helping her iron levels. Also causes significant constipation. She states that she has been having iron deficiency since 2003, which is when her last child was born. In 2018, she had a total of 3 IV iron treatments. She states that her iron levels returned to normal"ish" after that and she has not required further treatment.  Discussed with patient, that most likely cause of her iron deficiency anemia is blood loss from heavy menstrual cycles. This is the most common cause of iron deficiency anemia in women who have a monthly menstrual cycle. Will treat with IV Venofer 200 mg today. Will arrange for weekly Venofer 200 mg after she returns from travel around 09/28/2023. Will recheck labs and follow up with her in approximately 8 weeks. Will add B12, folate, and reticulocytes to labs. Will check celiac screening labs. Refer to gastroenterology for screening colonoscopy and possible endoscopy to evaluate for occult reasons for blood loss.

## 2023-09-17 ENCOUNTER — Encounter (HOSPITAL_BASED_OUTPATIENT_CLINIC_OR_DEPARTMENT_OTHER): Admitting: Physical Therapy

## 2023-09-20 ENCOUNTER — Encounter: Payer: Self-pay | Admitting: Nurse Practitioner

## 2023-09-20 ENCOUNTER — Encounter (HOSPITAL_BASED_OUTPATIENT_CLINIC_OR_DEPARTMENT_OTHER)

## 2023-09-24 ENCOUNTER — Encounter (HOSPITAL_BASED_OUTPATIENT_CLINIC_OR_DEPARTMENT_OTHER)

## 2023-09-27 ENCOUNTER — Encounter (HOSPITAL_BASED_OUTPATIENT_CLINIC_OR_DEPARTMENT_OTHER)

## 2023-09-30 ENCOUNTER — Encounter (HOSPITAL_BASED_OUTPATIENT_CLINIC_OR_DEPARTMENT_OTHER)

## 2023-09-30 ENCOUNTER — Telehealth: Payer: Self-pay

## 2023-09-30 NOTE — Telephone Encounter (Signed)
 Joy Cruz, patient will be scheduled as soon as possible.  Auth Submission: APPROVED Site of care: Site of care: [facility] Payer: [employer] commercial Medication & CPT/J Code(s) submitted: Venofer  (Iron  Sucrose) J1756 Diagnosis Code:  Route of submission (phone, fax, portal): portal Phone # Fax # Auth type: Buy/Bill PB Units/visits requested: 200mg  x 4 doses Reference number: [id number] Approval from: 09/23/23 to 09/22/24

## 2023-10-01 ENCOUNTER — Encounter (HOSPITAL_BASED_OUTPATIENT_CLINIC_OR_DEPARTMENT_OTHER)

## 2023-10-03 ENCOUNTER — Encounter (HOSPITAL_BASED_OUTPATIENT_CLINIC_OR_DEPARTMENT_OTHER)

## 2023-10-08 ENCOUNTER — Ambulatory Visit

## 2023-10-09 ENCOUNTER — Encounter (HOSPITAL_BASED_OUTPATIENT_CLINIC_OR_DEPARTMENT_OTHER): Admitting: Physical Therapy

## 2023-10-09 ENCOUNTER — Ambulatory Visit (HOSPITAL_BASED_OUTPATIENT_CLINIC_OR_DEPARTMENT_OTHER): Admitting: Orthopaedic Surgery

## 2023-10-09 ENCOUNTER — Other Ambulatory Visit (HOSPITAL_BASED_OUTPATIENT_CLINIC_OR_DEPARTMENT_OTHER): Payer: Self-pay

## 2023-10-09 DIAGNOSIS — S83242A Other tear of medial meniscus, current injury, left knee, initial encounter: Secondary | ICD-10-CM

## 2023-10-09 NOTE — Progress Notes (Signed)
 Post Operative Evaluation    Procedure/Date of Surgery: Left knee medial meniscal root repair 3/18  Interval History:    Resents today for follow-up status post above procedure.  She got extremely good relief from her injection.  She is not just experiencing a small pocket of swelling about the lateral knee. PMH/PSH/Family History/Social History/Meds/Allergies:    Past Medical History:  Diagnosis Date   Anemia    Diabetes mellitus without complication [facility])    Past Surgical History:  Procedure Laterality Date   CESAREAN SECTION     CHONDROPLASTY Left 07/02/2023   Procedure: FEMORAL PATELLA CHONDROPLASTY;  Surgeon: Genelle Standing, MD;  Location: [facility];  Service: Orthopedics;  Laterality: Left;   KNEE ARTHROSCOPY WITH MENISCAL REPAIR Left 07/02/2023   Procedure: LEFT KNEE ARTHROSCOPY WITH MENISCAL REPAIR;  Surgeon: Genelle Standing, MD;  Location: [facility];  Service: Orthopedics;  Laterality: Left;   Social History   Socioeconomic History   Marital status: Single    Spouse name: Not on file   Number of children: 3   Years of education: Not on file   Highest education level: Not on file  Occupational History   Not on file  Tobacco Use   Smoking status: Some Days    Types: Pipe    Passive exposure: Past   Smokeless tobacco: Never   Tobacco comments:    ceremonial reasons (only in July)  Vaping Use   Vaping status: Never Used  Substance and Sexual Activity   Alcohol use: Never   Drug use: Never   Sexual activity: Yes    Birth control/protection: None  Other Topics Concern   Not on file  Social History Narrative   Not on file   Social Drivers of Health   Financial Resource Strain: Not on file  Food Insecurity: No Food Insecurity (09/16/2023)   Hunger Vital Sign    Worried About Running Out of Food in the Last Year: Never true    Ran Out of Food in the Last Year: Never true  Transportation  Needs: No Transportation Needs (09/16/2023)   PRAPARE - Administrator, Civil Service (Medical): No    Lack of Transportation (Non-Medical): No  Physical Activity: Not on file  Stress: Not on file  Social Connections: Not on file   No family history on file. No Known Allergies Current Outpatient Medications  Medication Sig Dispense Refill   acetaminophen  (TYLENOL ) 500 MG tablet Take 1,000 mg by mouth every 6 (six) hours as needed for moderate pain or headache.     clindamycin  (CLEOCIN -T) 1 % lotion Apply topically 2 (two) times daily. 60 mL 0   cyclobenzaprine  (FLEXERIL ) 10 MG tablet Take 1 tablet (10 mg total) by mouth 3 (three) times daily as needed for muscle spasms. 30 tablet 0   ibuprofen  (ADVIL ) 800 MG tablet Take 800 mg by mouth every 8 (eight) hours as needed.     ibuprofen  (ADVIL ) 800 MG tablet Take 1 tablet (800 mg total) by mouth every 8 (eight) hours as needed. 30 tablet 0   IRON  PO Take 1 tablet by mouth 2 (two) times daily.     lisinopril (ZESTRIL) 20 MG tablet Take 20 mg by mouth daily.     metFORMIN (GLUCOPHAGE) 500 MG tablet Take 500 mg by mouth  2 (two) times daily with a meal.     triamcinolone  cream (KENALOG ) 0.1 % Apply 1 Application topically 2 (two) times daily. 30 g 0   No current facility-administered medications for this visit.   No results found.  Review of Systems:   A ROS was performed including pertinent positives and negatives as documented in the HPI.   Musculoskeletal Exam:    Last menstrual period 07/14/2023.  Left knee portals are well-appearing without erythema or drainage.  Mild knee effusion.  Range of motion is from 0-135.  She has good quad strength  Imaging:      I personally reviewed and interpreted the radiographs.   Assessment:   Status post left knee medial meniscal root repair doing well.  At this time she is still having some residual swelling consistent with synovitis.  At this time I do believe she would benefit from  final lateral ultrasound-guided injection.  This was provided verbal consent was obtained she will follow-up as needed Plan :    - Left knee ultrasound-guided injection provided after verbal consent obtained        I personally saw and evaluated the patient, and participated in the management and treatment plan.  Elspeth Parker, MD Attending Physician, Orthopedic Surgery  This document was dictated using Dragon voice recognition software. A reasonable attempt at proof reading has been made to minimize errors.

## 2023-10-11 ENCOUNTER — Encounter (HOSPITAL_BASED_OUTPATIENT_CLINIC_OR_DEPARTMENT_OTHER)

## 2023-10-14 ENCOUNTER — Encounter (HOSPITAL_BASED_OUTPATIENT_CLINIC_OR_DEPARTMENT_OTHER): Admitting: Orthopaedic Surgery

## 2023-10-15 ENCOUNTER — Ambulatory Visit (HOSPITAL_BASED_OUTPATIENT_CLINIC_OR_DEPARTMENT_OTHER): Attending: Orthopaedic Surgery

## 2023-10-15 ENCOUNTER — Other Ambulatory Visit: Payer: Self-pay

## 2023-10-15 ENCOUNTER — Ambulatory Visit

## 2023-10-15 DIAGNOSIS — R2689 Other abnormalities of gait and mobility: Secondary | ICD-10-CM | POA: Insufficient documentation

## 2023-10-15 DIAGNOSIS — M25562 Pain in left knee: Secondary | ICD-10-CM | POA: Insufficient documentation

## 2023-10-15 DIAGNOSIS — M25662 Stiffness of left knee, not elsewhere classified: Secondary | ICD-10-CM | POA: Insufficient documentation

## 2023-10-15 MED ORDER — IRON SUCROSE 20 MG/ML IV SOLN: 200.0000 mg | Freq: Once | INTRAVENOUS | Status: AC

## 2023-10-15 NOTE — Therapy (Incomplete)
 OUTPATIENT PHYSICAL THERAPY TREATMENT   Patient Name: Joy Cruz MRN: [id number] DOB:1976/04/12, 48 y.o., female Today's Date: 10/15/2023  END OF SESSION:         Past Medical History:  Diagnosis Date   Anemia    Diabetes mellitus without complication [facility])    Past Surgical History:  Procedure Laterality Date   CESAREAN SECTION     CHONDROPLASTY Left 07/02/2023   Procedure: FEMORAL PATELLA CHONDROPLASTY;  Surgeon: Genelle Standing, MD;  Location: [facility];  Service: Orthopedics;  Laterality: Left;   KNEE ARTHROSCOPY WITH MENISCAL REPAIR Left 07/02/2023   Procedure: LEFT KNEE ARTHROSCOPY WITH MENISCAL REPAIR;  Surgeon: Genelle Standing, MD;  Location: [facility];  Service: Orthopedics;  Laterality: Left;   Patient Active Problem List   Diagnosis Date Noted   Symptomatic anemia 09/13/2023   Other fatigue 09/12/2023   Female pattern hair loss 09/12/2023   Muscle spasms of lower extremity 09/12/2023   Rash and nonspecific skin eruption 09/12/2023   Elevated blood pressure reading in office without diagnosis of hypertension 09/12/2023   Tear of medial meniscus of left knee, current 07/02/2023   Establishing care with new doctor, encounter for 05/21/2023   Acute pain of left knee 05/21/2023   Diabetes mellitus without complication (HCC) 05/21/2023    PCP: Darice Brownie FNP  REFERRING PROVIDER: Dr Standing Jump MD   REFERRING DIAG: 1.  Left knee medial meniscal root repair with centralization 2.  Left knee medial femoral condyle abrasion arthroplasty  THERAPY DIAG:  No diagnosis found.  Rationale for Evaluation and Treatment: Rehabilitation  ONSET DATE: 07/02/2023 (meniscus repair surgery)  SUBJECTIVE:   Per eval - Patient is a 48 year old female status post left knee medial meniscal root repair with left knee medial femoral condyle abrasion arthroplasty on 07/02/2023.  The patient was stepping off a truck in August 2024 when she twisted  her knee.  She had continuous pain until her surgical repair.  At this time she is still having high levels of pain.  She reports her pain fluctuates between 7 and 9.  She feels significant stiffness.  SUBJECTIVE STATEMENT: Pt reports that the pain is increased today. She has had more swelling with increase in exercise. As she walks, it increases in swelling. Pain feels inside the knee.   PERTINENT HISTORY: DM2, C-section PAIN:  10/15/2023 7/10 medial/anterior knee    Per eval - Are you having pain? Yes: NPRS scale: 5/10 Pain location: Medial knee Pain description: Aching Aggravating factors: standing and walking  Relieving factors: rest, ice and pain medication   PRECAUTIONS: None  RED FLAGS: None   WEIGHT BEARING RESTRICTIONS: Yes NWB 2 weeks   FALLS:  Has patient fallen in last 6 months? No  LIVING ENVIRONMENT: No steps into her house  OCCUPATION:   Drives a truck   Hobbies: Data processing manager    PLOF: Independent  PATIENT GOALS:  Get back to regular activity  NEXT MD VISIT:  Next week   OBJECTIVE:  Note: Objective measures were completed at Evaluation unless otherwise noted.  DIAGNOSTIC FINDINGS:    PATIENT SURVEYS:  LEFS 5/80  COGNITION: Overall cognitive status: Within functional limits for tasks assessed     SENSATION: Numbness around the surgical site   EDEMA:  Circumferential: Will measure next visit  POSTURE: forward head  PALPATION: No unexpected tenderness to palpation   LOWER EXTREMITY ROM:  Passive ROM Right eval Left eval Left 07/12/23 Left 07/19/23 Left  4/15  Left 08/29/23  Hip flexion        Hip extension        Hip abduction        Hip adduction        Hip internal rotation        Hip external rotation        Knee flexion  30 with physical therapist able to self assist with strap to 40 degrees AA: 46-48deg AA: 71 deg seated w/ strap  AA: 94deg supine with strap 101 AROM  Knee extension  -5   -4 Lacking  13 Improves to 12  Ankle dorsiflexion        Ankle plantarflexion        Ankle inversion        Ankle eversion         (Blank rows = not tested)  LOWER EXTREMITY MMT:  MMT Right eval Left eval  Hip flexion    Hip extension    Hip abduction    Hip adduction    Hip internal rotation    Hip external rotation    Knee flexion    Knee extension    Ankle dorsiflexion    Ankle plantarflexion    Ankle inversion    Ankle eversion     (Blank rows = not tested) No formal MMT 2nd to recent surgery                                                                                                                                 TREATMENT DATE:   5/28  Bike revs with partial reps  Edu of swelling and pain management, HEP, POC    5/22  Bike partial revs  LAQ 5# 2 hold  3x10 Standing gastroc stretch 30s 3x Seated heel prop stretch 3 min (3 lbs on knee)   Heel rocking 2x20 HR/TR 20x     5/20: Bike partial revs  LAQ 3# 5 hold  3x8 Bridge 3x8 SLR 3x8 HR/TR 3x8   08/29/23 Bike partial revs 5 min Manual: retrograde edema massage Quad set with heel prop 5 x 10 second holds Supine active hamstring stretch 2 x 10 x 5 second holds  5/13  Compression; swelling, pain management, ROM focus  Bike partial revs  Edema sweeping Calf stretch with quad set heel prop 2x10 3s LAQ 5lbs 2x10 Quad set with heel prop 2x10 2s TKE GTB 2x10  AD height adjustment- pt presents with [facility]   5/8: Bike partial revs PROM L knee flexion/extension STM to distal HS Patella mobilizations Heel slides 5 x15 Supine sLR 3x10 Long sit HSS x60min LAQ 3# 5 hold 2x10      [facility] Adult PT Treatment:  DATE: 08/15/23 Therapeutic Exercise: Recumbent bike half revolutions x5 min total Prone knee hang 2x1 min with self over pressure Seated hamstring stretch 2x30 LAQ red TB 2x10 Hamstring red TB 2x10 Manual Therapy: IASTM and  STM for hamstring Neuromuscular re-ed: Prone quad set 2x10 Seated SLR 2x10 Shuttle leg press 87# double leg x10, SL 37# x10, 42# x10   [facility] Adult PT Treatment:                                                DATE: 08/12/23 Therapeutic Exercise: Recumbent bike half revolutions x5 min total Prone knee hang 2x1 min with self over pressure Prone quad stretch with strap 2x30 Prone quad set 2x10 Standing HR/TR   Therapeutic Activity: Gait in hall with [facility] Weight shifts at railing   [facility] Adult PT Treatment:                                                DATE: 08/09/23 Therapeutic Exercise: Recumbent bike half revolutions x5 min total Prone knee hang 2x1 min with self over pressure Prone quad stretch with strap 2x30 Neuromuscular re-ed: Prone quad set 2x10 Prone knee flex/ext with strap assist 2x10 cues for terminal knee ext Therapeutic Activity: Standing hip abd red TB around thighs 2x10 Attempted standing on L LE with full weight but unable to tolerate without feeling her knee is turning in Self Care: HEP updates   [facility] Adult PT Treatment:                                                DATE: 08/06/23 Therapeutic Exercise: Heel slide with strap 2x10  PROM L knee  Manual Therapy: Patellar mobs, STM L quad, HS  Therapeutic Activity: Sit<>stand x10 Gait with single crutch in hall Gait with SPC in hall Partial squats at rail with brace donned x8    PATIENT EDUCATION:  Education details: HEP, symptom management, post op precautions and surgical protocol Person educated: Patient Education method: Explanation, Demonstration, Tactile cues, Verbal cues, and Handouts Education comprehension: verbalized understanding, returned demonstration, verbal cues required, tactile cues required, and needs further education  HOME EXERCISE PROGRAM: Access Code: [email or web address] URL: [email or web address] Date: 08/15/2023 Prepared by: Gellen Mabry Marie Nonato  Exercises - 4 Way  Patellar Glide  - 1 x daily - 7 x weekly - 1 sets - 10 reps - Staggered Stance Forward Backward Weight Shift with Unilateral Counter Support  - 1 x daily - 7 x weekly - 2 sets - 10 reps - Prone Knee Extension Hang  - 1 x daily - 7 x weekly - 2 sets - 1 min hold - Prone Knee Flexion  - 1 x daily - 7 x weekly - 2 sets - 10 reps - Prone Quadriceps Set  - 1 x daily - 7 x weekly - 2 sets - 10 reps - 3 set hold - Hip Abduction with Resistance Loop  - 1 x daily - 7 x weekly - 2 sets - 10 reps - Small Range Straight Leg Raise  - 1 x daily - 7 x weekly -  2 sets - 10 reps - Seated Knee Extension with Resistance  - 1 x daily - 7 x weekly - 2 sets - 10 reps - Seated Hamstring Curls with Resistance  - 1 x daily - 7 x weekly - 2 sets - 10 reps   ASSESSMENT:  CLINICAL IMPRESSION:    Pt with increase in L knee pain and effusion of the knee that limits ability to meaningfully in therapy. Pt advised on pain management and swelling management. HEP and PT held at this time until pt returns to MD for further assessment. Patient will continue to benefit from skilled physical therapy in order to improve function and reduce impairment.     Per eval - Patient is a 48 year old female status post left medial meniscal root repair and femoral condyle chondroplasty on 07/02/2023.  She presents with expected limitations in range of motion, function, and strength, she is nonweightbearing at this time.  Therapy reviewed gait training with the patient.  Therapy also adjusted the patient's brace to fit better.  She is limited by pain today.  Patient was given a limited HEP to begin working on base range of motion.  She a advised not to move range of motion past 90 degrees.  She is nowhere near 90 degrees at this time.  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  OBJECTIVE IMPAIRMENTS: Abnormal gait, decreased activity tolerance, decreased knowledge of  condition, decreased knowledge of use of DME, decreased mobility, difficulty walking, decreased ROM, decreased strength, increased edema, and pain.   ACTIVITY LIMITATIONS: carrying, standing, squatting, sleeping, stairs, transfers, bed mobility, bathing, toileting, dressing, hygiene/grooming, and locomotion level  PARTICIPATION LIMITATIONS: meal prep, cleaning, laundry, driving, shopping, community activity, occupation, and yard work  PERSONAL FACTORS: high co-pay   REHAB POTENTIAL: Good  CLINICAL DECISION MAKING: Stable/uncomplicated  EVALUATION COMPLEXITY: Low   GOALS: Goals reviewed with patient? Yes  SHORT TERM GOALS: Target date: 08/01/2023   Patient will increase passive knee flexion and 90 degrees Baseline: Goal status: MET 4/15  2.  Patient will wean off crutches and increase weightbearing as tolerated Baseline:  Goal status: IN PROGRESS 4/15  3.  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  Patient will return to kayaking without pain Baseline:  Goal status: INITIAL  3.  Patient will go up and down 8 steps with reciprocal gait pattern without pain Baseline:  Goal status: INITIAL    PLAN:  PT FREQUENCY: 2x/week  PT DURATION: 8 weeks  PLANNED INTERVENTIONS: 97110-Therapeutic exercises, 97530- Therapeutic activity, [id number]- Neuromuscular re-education, 97535- Self Care, [id number]- Manual therapy, [id number]- Gait training, [phone]- Aquatic Therapy, 97014- Electrical stimulation (unattended), 97035- Ultrasound, Patient/Family education, Stair training, Taping, Dry Needling, DME instructions, Cryotherapy, and Moist heat   PLAN FOR NEXT SESSION: Patient's visits may be limited by excessively high co-pay.  Based plan of care on how many visits we will be able to see her for.  Continue per meniscal  repair protocol. Amb without a/d as tolerated.    Asberry BRAVO Ivet Guerrieri, PTA 10/15/2023 1:40 PM

## 2023-10-17 ENCOUNTER — Encounter (HOSPITAL_BASED_OUTPATIENT_CLINIC_OR_DEPARTMENT_OTHER): Admitting: Physical Therapy

## 2023-10-17 ENCOUNTER — Telehealth: Payer: Self-pay | Admitting: Nurse Practitioner

## 2023-10-17 ENCOUNTER — Telehealth: Payer: Self-pay

## 2023-10-17 ENCOUNTER — Other Ambulatory Visit: Payer: Self-pay | Admitting: Nurse Practitioner

## 2023-10-17 NOTE — Telephone Encounter (Signed)
 I called and spoke with the patient. Added new therapy plan through infusion navigator for her to have IV iron  at [facility] infusion [facility]. I advised her that they would be calling to schedule appointments for iron  infusions. She voiced understanding.  -Powell Lessen, NP

## 2023-10-17 NOTE — Progress Notes (Signed)
 Added new therapy plan through infusion navigator for her to have IV iron  at [facility] cone infusion center. I advised her that they would be calling to schedule appointments for iron  infusions. She voiced understanding.

## 2023-10-17 NOTE — Telephone Encounter (Signed)
 Pt called again today requesting to speak with Powell Lessen, NP regarding IV Iron  infusions that were scheduled at [facility].  Pt stated she never received a call from [facility].  Stated this nurse will make Powell aware again of the pt's request to speak with her.

## 2023-10-22 ENCOUNTER — Encounter (HOSPITAL_BASED_OUTPATIENT_CLINIC_OR_DEPARTMENT_OTHER)

## 2023-10-23 ENCOUNTER — Ambulatory Visit

## 2023-10-23 MED ORDER — IRON SUCROSE 20 MG/ML IV SOLN: 200.0000 mg | Freq: Once | INTRAVENOUS | Status: DC

## 2023-10-24 ENCOUNTER — Encounter: Payer: Self-pay | Admitting: Nurse Practitioner

## 2023-10-24 ENCOUNTER — Other Ambulatory Visit (HOSPITAL_BASED_OUTPATIENT_CLINIC_OR_DEPARTMENT_OTHER): Payer: Self-pay

## 2023-10-24 ENCOUNTER — Other Ambulatory Visit (HOSPITAL_BASED_OUTPATIENT_CLINIC_OR_DEPARTMENT_OTHER): Payer: Self-pay | Admitting: Orthopaedic Surgery

## 2023-10-24 ENCOUNTER — Telehealth (HOSPITAL_BASED_OUTPATIENT_CLINIC_OR_DEPARTMENT_OTHER): Payer: Self-pay | Admitting: Orthopaedic Surgery

## 2023-10-24 ENCOUNTER — Encounter (HOSPITAL_COMMUNITY): Payer: Self-pay | Admitting: Nurse Practitioner

## 2023-10-24 ENCOUNTER — Encounter (HOSPITAL_BASED_OUTPATIENT_CLINIC_OR_DEPARTMENT_OTHER): Admitting: Physical Therapy

## 2023-10-24 MED ORDER — IBUPROFEN 800 MG PO TABS
800.0000 mg | ORAL_TABLET | Freq: Three times a day (TID) | ORAL | 2 refills | Status: AC
Start: 2023-10-24 — End: 2023-11-23
  Filled 2023-10-24: qty 30, 10d supply, fill #0

## 2023-10-24 NOTE — Telephone Encounter (Signed)
 Patient would like Ibuprofen   800  sent to [facility] for pain.

## 2023-10-30 ENCOUNTER — Ambulatory Visit

## 2023-11-04 ENCOUNTER — Other Ambulatory Visit (HOSPITAL_BASED_OUTPATIENT_CLINIC_OR_DEPARTMENT_OTHER): Payer: Self-pay

## 2023-11-10 NOTE — Assessment & Plan Note (Deleted)
 referred by her PCP. Was seen due to fatigue and recent hair loss. Recent labs showed low hgb of 8.8. iron was 20, iron saturation was 5, and ferritin level was 5. She has required iron infusions in the past. She does take oral iron everyday, however, this does not seem to be helping her iron levels. Also causes significant constipation. She states that she has been having iron deficiency since 2003, which is when her last child was born. In 2018, she had a total of 3 IV iron treatments. She states that her iron levels returned to normal"ish" after that and she has not required further treatment.  Discussed with patient, that most likely cause of her iron deficiency anemia is blood loss from heavy menstrual cycles. This is the most common cause of iron deficiency anemia in women who have a monthly menstrual cycle. Will treat with IV Venofer 200 mg today. Will arrange for weekly Venofer 200 mg after she returns from travel around 09/28/2023. Will recheck labs and follow up with her in approximately 8 weeks. Will add B12, folate, and reticulocytes to labs. Will check celiac screening labs. Refer to gastroenterology for screening colonoscopy and possible endoscopy to evaluate for occult reasons for blood loss.

## 2023-11-10 NOTE — Progress Notes (Deleted)
 Patient Care Team: Booker Darice SAUNDERS, FNP as PCP - General (Family Medicine)  Clinic Day:  11/10/2023  Referring physician: Booker Darice SAUNDERS, FNP  ASSESSMENT & PLAN:   Assessment & Plan: Symptomatic anemia referred by her PCP. Was seen due to fatigue and recent hair loss. Recent labs showed low hgb of 8.8. iron  was 20, iron  saturation was 5, and ferritin level was 5. She has required iron  infusions in the past. She does take oral iron  everyday, however, this does not seem to be helping her iron  levels. Also causes significant constipation. She states that she has been having iron  deficiency since 2003, which is when her last child was born. In 2018, she had a total of 3 IV iron  treatments. She states that her iron  levels returned to [facility] after that and she has not required further treatment.  Discussed with patient, that most likely cause of her iron  deficiency anemia is blood loss from heavy menstrual cycles. This is the most common cause of iron  deficiency anemia in women who have a monthly menstrual cycle. Will treat with IV Venofer  200 mg today. Will arrange for weekly Venofer  200 mg after she returns from travel around 09/28/2023. Will recheck labs and follow up with her in approximately 8 weeks. Will add B12, folate, and reticulocytes to labs. Will check celiac screening labs. Refer to gastroenterology for screening colonoscopy and possible endoscopy to evaluate for occult reasons for blood loss.    The patient understands the plans discussed today and is in agreement with them.  She knows to contact our office if she develops concerns prior to her next appointment.  I provided *** minutes of face-to-face time during this encounter and > 50% was spent counseling as documented under my assessment and plan.    Powell FORBES Lessen, NP  [facility] - A DEPT OF [facility] [address] [place] [address] Dept:  [phone] Dept Fax: [phone]   No orders of the defined types were placed in this encounter.     CHIEF COMPLAINT:  CC: Iron  deficiency anemia  Current Treatment: Weekly Venofer  as needed  INTERVAL HISTORY:  Everette is here today for repeat clinical assessment.  Was initially seen on 09/16/2023.  She denies fevers or chills. She denies pain. Her appetite is good. Her weight {Weight change:10426}.  I have reviewed the past medical history, past surgical history, social history and family history with the patient and they are unchanged from previous note.  ALLERGIES:  has no known allergies.  MEDICATIONS:  Current Outpatient Medications  Medication Sig Dispense Refill   ibuprofen  (ADVIL ) 800 MG tablet Take 1 tablet (800 mg total) by mouth every 8 (eight) hours. Please take with food, please alternate with acetaminophen  30 tablet 2   acetaminophen  (TYLENOL ) 500 MG tablet Take 1,000 mg by mouth every 6 (six) hours as needed for moderate pain or headache.     clindamycin  (CLEOCIN -T) 1 % lotion Apply topically 2 (two) times daily. 60 mL 0   cyclobenzaprine  (FLEXERIL ) 10 MG tablet Take 1 tablet (10 mg total) by mouth 3 (three) times daily as needed for muscle spasms. 30 tablet 0   ibuprofen  (ADVIL ) 800 MG tablet Take 800 mg by mouth every 8 (eight) hours as needed.     ibuprofen  (ADVIL ) 800 MG tablet Take 1 tablet (800 mg total) by mouth every 8 (eight) hours as needed. 30 tablet 0   IRON  PO Take  1 tablet by mouth 2 (two) times daily.     lisinopril (ZESTRIL) 20 MG tablet Take 20 mg by mouth daily.     metFORMIN (GLUCOPHAGE) 500 MG tablet Take 500 mg by mouth 2 (two) times daily with a meal.     triamcinolone  cream (KENALOG ) 0.1 % Apply 1 Application topically 2 (two) times daily. 30 g 0   No current facility-administered medications for this visit.   Facility-Administered Medications Ordered in Other Visits  Medication Dose Route Frequency Provider Last Rate Last Admin   iron  sucrose  (VENOFER ) injection 200 mg  200 mg Intravenous Once Cato Liburd, Powell BRAVO, NP        HISTORY OF PRESENT ILLNESS:   Oncology History   No history exists.      REVIEW OF SYSTEMS:   Constitutional: Denies fevers, chills or abnormal weight loss Eyes: Denies blurriness of vision Ears, nose, mouth, throat, and face: Denies mucositis or sore throat Respiratory: Denies cough, dyspnea or wheezes Cardiovascular: Denies palpitation, chest discomfort or lower extremity swelling Gastrointestinal:  Denies nausea, heartburn or change in bowel habits Skin: Denies abnormal skin rashes Lymphatics: Denies new lymphadenopathy or easy bruising Neurological:Denies numbness, tingling or new weaknesses Behavioral/Psych: Mood is stable, no new changes  All other systems were reviewed with the patient and are negative.   VITALS:  There were no vitals taken for this visit.  Wt Readings from Last 3 Encounters:  09/16/23 279 lb 12.8 oz (126.9 kg)  09/12/23 286 lb (129.7 kg)  07/02/23 284 lb 6.3 oz (129 kg)    There is no height or weight on file to calculate BMI.  Performance status (ECOG): {CHL ONC [id number]  PHYSICAL EXAM:   GENERAL:alert, no distress and comfortable SKIN: skin color, texture, turgor are normal, no rashes or significant lesions EYES: normal, Conjunctiva are pink and non-injected, sclera clear OROPHARYNX:no exudate, no erythema and lips, buccal mucosa, and tongue normal  NECK: supple, thyroid normal size, non-tender, without nodularity LYMPH:  no palpable lymphadenopathy in the cervical, axillary or inguinal LUNGS: clear to auscultation and percussion with normal breathing effort HEART: regular rate & rhythm and no murmurs and no lower extremity edema ABDOMEN:abdomen soft, non-tender and normal bowel sounds Musculoskeletal:no cyanosis of digits and no clubbing  NEURO: alert & oriented x 3 with fluent speech, no focal motor/sensory deficits  LABORATORY DATA:  I have reviewed  the data as listed    Component Value Date/Time   NA 138 09/12/2023 0950   K 4.9 09/12/2023 0950   CL 105 09/12/2023 0950   CO2 19 (L) 09/12/2023 0950   GLUCOSE 169 (H) 09/12/2023 0950   GLUCOSE 146 (H) 06/25/2023 1230   BUN 13 09/12/2023 0950   CREATININE 0.63 09/12/2023 0950   CALCIUM 9.2 09/12/2023 0950   PROT 6.8 09/12/2023 0950   ALBUMIN 4.0 09/12/2023 0950   AST 18 09/12/2023 0950   ALT 16 09/12/2023 0950   ALKPHOS 140 (H) 09/12/2023 0950   BILITOT 0.2 09/12/2023 0950   GFRNONAA >60 06/25/2023 1230   GFRAA >60 03/09/2018 1012    No results found for: SPEP, UPEP  Lab Results  Component Value Date   WBC 8.0 09/12/2023   HGB 8.8 (L) 09/12/2023   HCT 28.7 (L) 09/12/2023   MCV 68 (L) 09/12/2023   PLT 421 09/12/2023      Chemistry      Component Value Date/Time   NA 138 09/12/2023 0950   K 4.9 09/12/2023 0950   CL 105 09/12/2023 0950  CO2 19 (L) 09/12/2023 0950   BUN 13 09/12/2023 0950   CREATININE 0.63 09/12/2023 0950      Component Value Date/Time   CALCIUM 9.2 09/12/2023 0950   ALKPHOS 140 (H) 09/12/2023 0950   AST 18 09/12/2023 0950   ALT 16 09/12/2023 0950   BILITOT 0.2 09/12/2023 0950       RADIOGRAPHIC STUDIES: I have personally reviewed the radiological images as listed and agreed with the findings in the report. No results found.

## 2023-11-11 ENCOUNTER — Inpatient Hospital Stay: Admitting: Nurse Practitioner

## 2023-11-11 ENCOUNTER — Telehealth: Payer: Self-pay

## 2023-11-11 ENCOUNTER — Inpatient Hospital Stay: Attending: Nurse Practitioner

## 2023-11-11 DIAGNOSIS — D649 Anemia, unspecified: Secondary | ICD-10-CM

## 2023-11-11 NOTE — Telephone Encounter (Signed)
 Called patient due to not showing up for her 1100 lab app and 1130 app with Powell Lessen NP. Will no show patient today called and left a VM to call us  back to reschedule her app.

## 2024-02-17 ENCOUNTER — Encounter: Payer: Self-pay | Admitting: Radiology
